# Patient Record
Sex: Female | Born: 1955 | Race: Black or African American | Hispanic: No | Marital: Married | Smoking: Never smoker
Health system: Southern US, Community
[De-identification: ages and names within clinical notes are randomized; demographics above are authoritative.]

## PROBLEM LIST (undated history)

## (undated) DIAGNOSIS — I10 Essential (primary) hypertension: Secondary | ICD-10-CM

## (undated) DIAGNOSIS — T8859XA Other complications of anesthesia, initial encounter: Secondary | ICD-10-CM

## (undated) DIAGNOSIS — E785 Hyperlipidemia, unspecified: Secondary | ICD-10-CM

## (undated) DIAGNOSIS — Z9889 Other specified postprocedural states: Secondary | ICD-10-CM

## (undated) DIAGNOSIS — E119 Type 2 diabetes mellitus without complications: Secondary | ICD-10-CM

## (undated) DIAGNOSIS — R112 Nausea with vomiting, unspecified: Secondary | ICD-10-CM

## (undated) DIAGNOSIS — T4145XA Adverse effect of unspecified anesthetic, initial encounter: Secondary | ICD-10-CM

## (undated) HISTORY — DX: Type 2 diabetes mellitus without complications: E11.9

## (undated) HISTORY — DX: Hyperlipidemia, unspecified: E78.5

## (undated) HISTORY — PX: COLONOSCOPY: SHX174

## (undated) HISTORY — DX: Essential (primary) hypertension: I10

## (undated) HISTORY — PX: ABDOMINAL HYSTERECTOMY: SHX81

## (undated) HISTORY — PX: HERNIA REPAIR: SHX51

---

## 2004-12-04 ENCOUNTER — Encounter: Admission: RE | Admit: 2004-12-04 | Discharge: 2004-12-04 | Payer: Self-pay | Admitting: "Endocrinology

## 2006-01-02 ENCOUNTER — Encounter: Admission: RE | Admit: 2006-01-02 | Discharge: 2006-01-02 | Payer: Self-pay | Admitting: "Endocrinology

## 2006-10-03 ENCOUNTER — Ambulatory Visit: Payer: Self-pay | Admitting: Internal Medicine

## 2006-10-23 ENCOUNTER — Ambulatory Visit: Payer: Self-pay | Admitting: Gastroenterology

## 2007-03-31 ENCOUNTER — Encounter: Admission: RE | Admit: 2007-03-31 | Discharge: 2007-03-31 | Payer: Self-pay | Admitting: Internal Medicine

## 2008-04-28 ENCOUNTER — Encounter: Admission: RE | Admit: 2008-04-28 | Discharge: 2008-04-28 | Payer: Self-pay | Admitting: Internal Medicine

## 2009-08-30 ENCOUNTER — Encounter: Admission: RE | Admit: 2009-08-30 | Discharge: 2009-08-30 | Payer: Self-pay | Admitting: Internal Medicine

## 2010-07-02 ENCOUNTER — Encounter: Payer: Self-pay | Admitting: Internal Medicine

## 2010-12-08 ENCOUNTER — Other Ambulatory Visit: Payer: Self-pay | Admitting: Internal Medicine

## 2010-12-08 DIAGNOSIS — Z1231 Encounter for screening mammogram for malignant neoplasm of breast: Secondary | ICD-10-CM

## 2010-12-22 ENCOUNTER — Ambulatory Visit
Admission: RE | Admit: 2010-12-22 | Discharge: 2010-12-22 | Disposition: A | Discharge status: Home / Self Care | Payer: BC Managed Care – PPO | Source: Ambulatory Visit | Attending: Internal Medicine | Admitting: Internal Medicine

## 2010-12-22 DIAGNOSIS — Z1231 Encounter for screening mammogram for malignant neoplasm of breast: Secondary | ICD-10-CM

## 2011-01-10 ENCOUNTER — Encounter (INDEPENDENT_AMBULATORY_CARE_PROVIDER_SITE_OTHER): Payer: BC Managed Care – PPO | Admitting: Ophthalmology

## 2011-06-13 ENCOUNTER — Encounter (INDEPENDENT_AMBULATORY_CARE_PROVIDER_SITE_OTHER): Payer: BC Managed Care – PPO | Admitting: Ophthalmology

## 2011-06-27 ENCOUNTER — Encounter (INDEPENDENT_AMBULATORY_CARE_PROVIDER_SITE_OTHER): Payer: BC Managed Care – PPO | Admitting: Ophthalmology

## 2011-06-27 DIAGNOSIS — E11319 Type 2 diabetes mellitus with unspecified diabetic retinopathy without macular edema: Secondary | ICD-10-CM

## 2011-06-27 DIAGNOSIS — H43819 Vitreous degeneration, unspecified eye: Secondary | ICD-10-CM

## 2011-06-27 DIAGNOSIS — H2510 Age-related nuclear cataract, unspecified eye: Secondary | ICD-10-CM

## 2011-06-27 DIAGNOSIS — E1139 Type 2 diabetes mellitus with other diabetic ophthalmic complication: Secondary | ICD-10-CM

## 2012-02-26 ENCOUNTER — Other Ambulatory Visit: Payer: Self-pay | Admitting: Internal Medicine

## 2012-02-26 DIAGNOSIS — Z1231 Encounter for screening mammogram for malignant neoplasm of breast: Secondary | ICD-10-CM

## 2012-03-17 ENCOUNTER — Ambulatory Visit
Admission: RE | Admit: 2012-03-17 | Discharge: 2012-03-17 | Disposition: A | Discharge status: Home / Self Care | Payer: BC Managed Care – PPO | Source: Ambulatory Visit | Attending: Internal Medicine | Admitting: Internal Medicine

## 2012-03-17 ENCOUNTER — Ambulatory Visit: Payer: BC Managed Care – PPO

## 2012-03-17 DIAGNOSIS — Z1231 Encounter for screening mammogram for malignant neoplasm of breast: Secondary | ICD-10-CM

## 2012-03-19 ENCOUNTER — Encounter (INDEPENDENT_AMBULATORY_CARE_PROVIDER_SITE_OTHER): Payer: BC Managed Care – PPO | Admitting: Ophthalmology

## 2012-03-19 DIAGNOSIS — E11319 Type 2 diabetes mellitus with unspecified diabetic retinopathy without macular edema: Secondary | ICD-10-CM

## 2012-03-19 DIAGNOSIS — H35039 Hypertensive retinopathy, unspecified eye: Secondary | ICD-10-CM

## 2012-03-19 DIAGNOSIS — E1139 Type 2 diabetes mellitus with other diabetic ophthalmic complication: Secondary | ICD-10-CM

## 2012-03-19 DIAGNOSIS — H43819 Vitreous degeneration, unspecified eye: Secondary | ICD-10-CM

## 2012-03-19 DIAGNOSIS — I10 Essential (primary) hypertension: Secondary | ICD-10-CM

## 2012-03-19 DIAGNOSIS — H2510 Age-related nuclear cataract, unspecified eye: Secondary | ICD-10-CM

## 2012-06-27 ENCOUNTER — Ambulatory Visit (INDEPENDENT_AMBULATORY_CARE_PROVIDER_SITE_OTHER): Payer: BC Managed Care – PPO | Admitting: Ophthalmology

## 2012-11-09 ENCOUNTER — Ambulatory Visit (INDEPENDENT_AMBULATORY_CARE_PROVIDER_SITE_OTHER): Payer: BC Managed Care – PPO | Admitting: Family Medicine

## 2012-11-09 VITALS — BP 113/71 | HR 97 | Temp 98.3°F | Resp 20 | Ht 63.0 in | Wt 217.0 lb

## 2012-11-09 DIAGNOSIS — R059 Cough, unspecified: Secondary | ICD-10-CM

## 2012-11-09 DIAGNOSIS — R05 Cough: Secondary | ICD-10-CM

## 2012-11-09 DIAGNOSIS — J029 Acute pharyngitis, unspecified: Secondary | ICD-10-CM

## 2012-11-09 DIAGNOSIS — E119 Type 2 diabetes mellitus without complications: Secondary | ICD-10-CM

## 2012-11-09 LAB — POCT CBC
Granulocyte percent: 73.4 %G (ref 37–80)
HCT, POC: 39.8 % (ref 37.7–47.9)
Lymph, poc: 1.5 (ref 0.6–3.4)
MCHC: 30.2 g/dL — AB (ref 31.8–35.4)
POC LYMPH PERCENT: 20.2 %L (ref 10–50)
Platelet Count, POC: 250 10*3/uL (ref 142–424)
RBC: 4 M/uL — AB (ref 4.04–5.48)
WBC: 7.6 10*3/uL (ref 4.6–10.2)

## 2012-11-09 LAB — GLUCOSE, POCT (MANUAL RESULT ENTRY): POC Glucose: 118 mg/dl — AB (ref 70–99)

## 2012-11-09 LAB — POCT RAPID STREP A (OFFICE): Rapid Strep A Screen: NEGATIVE

## 2012-11-09 MED ORDER — HYDROCOD POLST-CHLORPHEN POLST 10-8 MG/5ML PO LQCR: 5.0000 mL | Freq: Two times a day (BID) | ORAL | Status: DC | PRN

## 2012-11-09 MED ORDER — AMOXICILLIN-POT CLAVULANATE 875-125 MG PO TABS: 1.0000 | ORAL_TABLET | Freq: Two times a day (BID) | ORAL | Status: DC

## 2012-11-09 NOTE — Patient Instructions (Addendum)

## 2012-11-09 NOTE — Progress Notes (Signed)
[address]   [place], [place]  [address]   [phone]  Subjective:    Patient ID: Faith Perez, female    DOB: Jun 07, 1956, 57 y.o.   MRN: [id number]  HPI This 57 y.o. female presents for evaluation of cold symptoms. Onset five days ago.  +feverish.  +chills/sweats.  +HA yesterday.  No ear pain today but has been painful earlier in week.  +ST moderately severe; diffuse pain; pain with swallowing; no pain with talking.  Scant rhinorrhea; no nasal congestion; +coughing a lot; +scant sputum.  +SOB; +fatigued/malaise.  No wheezing.  No v/d.  Husband with similar symptoms.  No tobacco.  Not checking sugars at home.  PCP: Light.   Review of Systems  Constitutional: Positive for fever, chills, diaphoresis and fatigue.  HENT: Negative for ear pain, congestion, rhinorrhea, sneezing and postnasal drip.   Respiratory: Positive for cough and shortness of breath. Negative for wheezing and stridor.   Gastrointestinal: Negative for nausea, vomiting and diarrhea.  Skin: Negative for rash.  Neurological: Positive for headaches. Negative for dizziness and light-headedness.    Past Medical History  Diagnosis Date  . Hypertension   . Diabetes mellitus without complication   . Hyperlipidemia     Past Surgical History  Procedure Laterality Date  . Hernia repair    . Abdominal hysterectomy      Prior to Admission medications   Medication Sig Start Date End Date Taking? Authorizing Provider  aspirin 81 MG tablet Take 81 mg by mouth daily.   Yes Historical Provider, MD  ezetimibe (ZETIA) 10 MG tablet Take 10 mg by mouth daily.   Yes Historical Provider, MD  lisinopril-hydrochlorothiazide (PRINZIDE,ZESTORETIC) 20-25 MG per tablet Take 1 tablet by mouth daily.   Yes Historical Provider, MD  metFORMIN (GLUCOPHAGE) 500 MG tablet Take 500 mg by mouth once.   Yes Historical Provider, MD  pioglitazone (ACTOS) 45 MG tablet Take 45 mg by mouth daily.   Yes Historical Provider, MD    No Known  Allergies  History   Social History  . Marital Status: Married    Spouse Name: N/A    Number of Children: N/A  . Years of Education: N/A   Occupational History  . employed     works in school; Musician   Social History Main Topics  . Smoking status: Never Smoker   . Smokeless tobacco: Not on file  . Alcohol Use: Yes     Comment: social  . Drug Use: Not on file  . Sexually Active: Not on file   Other Topics Concern  . Not on file   Social History Narrative  . No narrative on file    Family History  Problem Relation Age of Onset  . Heart failure Mother   . Hypertension Mother   . Diabetes Sister   . Diabetes Sister        Objective:   Physical Exam  Nursing note and vitals reviewed. Constitutional: She is oriented to person, place, and time. She appears well-developed and well-nourished. No distress.  HENT:  Head: Normocephalic and atraumatic.  Right Ear: External ear normal.  Left Ear: External ear normal.  Nose: Nose normal.  Mouth/Throat: Oropharynx is clear and moist. No oropharyngeal exudate.  Eyes: Conjunctivae are normal. Pupils are equal, round, and reactive to light.  Neck: Normal range of motion. Neck supple.  Cardiovascular: Normal rate, regular rhythm and normal heart sounds.   No murmur heard. Pulmonary/Chest: Effort normal and breath sounds normal. She  has no wheezes. She has no rales.  Lymphadenopathy:    She has no cervical adenopathy.  Neurological: She is alert and oriented to person, place, and time.  Skin: She is not diaphoretic.  Psychiatric: She has a normal mood and affect. Her behavior is normal.    Results for orders placed in visit on 11/09/12  GLUCOSE, POCT (MANUAL RESULT ENTRY)      Result Value Range   POC Glucose 118 (*) 70 - 99 mg/dl  POCT CBC      Result Value Range   WBC 7.6  4.6 - 10.2 K/uL   Lymph, poc 1.5  0.6 - 3.4   POC LYMPH PERCENT 20.2  10 - 50 %L   MID (cbc) 0.5  0 - 0.9   POC MID % 6.4  0 - 12 %M   POC  Granulocyte 5.6  2 - 6.9   Granulocyte percent 73.4  37 - 80 %G   RBC 4.00 (*) 4.04 - 5.48 M/uL   Hemoglobin 12.0 (*) 12.2 - 16.2 g/dL   HCT, POC 16.1  09.6 - 47.9 %   MCV 99.4 (*) 80 - 97 fL   MCH, POC 30.0  27 - 31.2 pg   MCHC 30.2 (*) 31.8 - 35.4 g/dL   RDW, POC 04.5     Platelet Count, POC 250  142 - 424 K/uL   MPV 9.0  0 - 99.8 fL  POCT RAPID STREP A (OFFICE)      Result Value Range   Rapid Strep A Screen Negative  Negative       Assessment & Plan:  Sore throat - Plan: POCT CBC, POCT rapid strep A, Culture, Group A Strep  Diabetes - Plan: POCT glucose (manual entry)  Cough - Plan: POCT CBC  1. URI:  New.  Rx for Augmentin, Tussionex due to clinical worsening over past several days .  Supportive care with rest, fluids, Ibuprofen or Tylenol. 2.  DMII:  Stable; monitor sugars closely with acute illness.    Meds ordered this encounter  Medications  . ezetimibe (ZETIA) 10 MG tablet    Sig: Take 10 mg by mouth daily.  . metFORMIN (GLUCOPHAGE) 500 MG tablet    Sig: Take 500 mg by mouth once.  Marland Kitchen aspirin 81 MG tablet    Sig: Take 81 mg by mouth daily.  Marland Kitchen lisinopril-hydrochlorothiazide (PRINZIDE,ZESTORETIC) 20-25 MG per tablet    Sig: Take 1 tablet by mouth daily.  . pioglitazone (ACTOS) 45 MG tablet    Sig: Take 45 mg by mouth daily.  . chlorpheniramine-HYDROcodone (TUSSIONEX PENNKINETIC ER) 10-8 MG/5ML LQCR    Sig: Take 5 mLs by mouth every 12 (twelve) hours as needed.    Dispense:  240 mL    Refill:  0  . amoxicillin-clavulanate (AUGMENTIN) 875-125 MG per tablet    Sig: Take 1 tablet by mouth 2 (two) times daily.    Dispense:  20 tablet    Refill:  0

## 2012-11-11 LAB — CULTURE, GROUP A STREP: Organism ID, Bacteria: NORMAL

## 2013-03-17 ENCOUNTER — Other Ambulatory Visit: Payer: Self-pay

## 2013-03-17 DIAGNOSIS — Z1231 Encounter for screening mammogram for malignant neoplasm of breast: Secondary | ICD-10-CM

## 2013-03-30 ENCOUNTER — Ambulatory Visit: Payer: BC Managed Care – PPO

## 2013-04-16 ENCOUNTER — Ambulatory Visit
Admission: RE | Admit: 2013-04-16 | Discharge: 2013-04-16 | Disposition: A | Discharge status: Home / Self Care | Payer: BC Managed Care – PPO | Source: Ambulatory Visit

## 2013-04-16 DIAGNOSIS — Z1231 Encounter for screening mammogram for malignant neoplasm of breast: Secondary | ICD-10-CM

## 2014-03-12 ENCOUNTER — Other Ambulatory Visit: Payer: Self-pay

## 2014-03-12 DIAGNOSIS — Z1239 Encounter for other screening for malignant neoplasm of breast: Secondary | ICD-10-CM

## 2014-04-21 ENCOUNTER — Ambulatory Visit
Admission: RE | Admit: 2014-04-21 | Discharge: 2014-04-21 | Disposition: A | Discharge status: Home / Self Care | Payer: BC Managed Care – PPO | Source: Ambulatory Visit

## 2014-04-21 ENCOUNTER — Encounter (INDEPENDENT_AMBULATORY_CARE_PROVIDER_SITE_OTHER): Payer: BC Managed Care – PPO | Admitting: Ophthalmology

## 2014-04-21 DIAGNOSIS — Z1239 Encounter for other screening for malignant neoplasm of breast: Secondary | ICD-10-CM

## 2014-06-23 ENCOUNTER — Encounter (INDEPENDENT_AMBULATORY_CARE_PROVIDER_SITE_OTHER): Payer: BC Managed Care – PPO | Admitting: Ophthalmology

## 2014-06-23 DIAGNOSIS — E11311 Type 2 diabetes mellitus with unspecified diabetic retinopathy with macular edema: Secondary | ICD-10-CM

## 2014-06-23 DIAGNOSIS — E11329 Type 2 diabetes mellitus with mild nonproliferative diabetic retinopathy without macular edema: Secondary | ICD-10-CM

## 2014-06-23 DIAGNOSIS — H35033 Hypertensive retinopathy, bilateral: Secondary | ICD-10-CM

## 2014-06-23 DIAGNOSIS — I10 Essential (primary) hypertension: Secondary | ICD-10-CM

## 2014-06-23 DIAGNOSIS — H43813 Vitreous degeneration, bilateral: Secondary | ICD-10-CM

## 2014-06-23 DIAGNOSIS — E11331 Type 2 diabetes mellitus with moderate nonproliferative diabetic retinopathy with macular edema: Secondary | ICD-10-CM

## 2014-12-11 ENCOUNTER — Ambulatory Visit (INDEPENDENT_AMBULATORY_CARE_PROVIDER_SITE_OTHER): Payer: BC Managed Care – PPO | Admitting: Emergency Medicine

## 2014-12-11 ENCOUNTER — Ambulatory Visit (INDEPENDENT_AMBULATORY_CARE_PROVIDER_SITE_OTHER): Payer: BC Managed Care – PPO

## 2014-12-11 VITALS — BP 142/72 | HR 73 | Temp 98.0°F | Resp 17 | Ht 63.0 in | Wt 234.2 lb

## 2014-12-11 DIAGNOSIS — R05 Cough: Secondary | ICD-10-CM

## 2014-12-11 DIAGNOSIS — I359 Nonrheumatic aortic valve disorder, unspecified: Secondary | ICD-10-CM

## 2014-12-11 DIAGNOSIS — E119 Type 2 diabetes mellitus without complications: Secondary | ICD-10-CM | POA: Diagnosis not present

## 2014-12-11 DIAGNOSIS — R059 Cough, unspecified: Secondary | ICD-10-CM

## 2014-12-11 LAB — POCT CBC
Granulocyte percent: 64.1 %G (ref 37–80)
HEMATOCRIT: 39 % (ref 37.7–47.9)
HEMOGLOBIN: 12.6 g/dL (ref 12.2–16.2)
Lymph, poc: 1.8 (ref 0.6–3.4)
MCH: 29.6 pg (ref 27–31.2)
MCHC: 32.2 g/dL (ref 31.8–35.4)
MCV: 91.9 fL (ref 80–97)
MID (CBC): 0.4 (ref 0–0.9)
MPV: 7.4 fL (ref 0–99.8)
POC GRANULOCYTE: 3.9 (ref 2–6.9)
POC LYMPH %: 28.9 % (ref 10–50)
POC MID %: 7 % (ref 0–12)
Platelet Count, POC: 271 10*3/uL (ref 142–424)
RBC: 4.24 M/uL (ref 4.04–5.48)
RDW, POC: 15.5 %
WBC: 6.1 10*3/uL (ref 4.6–10.2)

## 2014-12-11 LAB — GLUCOSE, POCT (MANUAL RESULT ENTRY): POC GLUCOSE: 103 mg/dL — AB (ref 70–99)

## 2014-12-11 MED ORDER — CEFDINIR 300 MG PO CAPS: 600.0000 mg | ORAL_CAPSULE | Freq: Every day | ORAL | Status: DC

## 2014-12-11 MED ORDER — HYDROCOD POLST-CPM POLST ER 10-8 MG/5ML PO SUER: ORAL | Status: DC

## 2014-12-11 MED ORDER — ALBUTEROL SULFATE (2.5 MG/3ML) 0.083% IN NEBU
2.5000 mg | INHALATION_SOLUTION | Freq: Once | RESPIRATORY_TRACT | Status: AC
  Administered 2014-12-11: 2.5 mg via RESPIRATORY_TRACT

## 2014-12-11 NOTE — Progress Notes (Addendum)
Subjective:  This chart was scribed for Nena Jordan, MD by [facility], medical scribe at [facility].The patient was seen in exam room 12 and the patient's care was started at 9:58 AM.   Patient ID: Faith Perez, female    DOB: December 09, 1955, 59 y.o.   MRN: [id number] Chief Complaint  Patient presents with   Bronchitis    dia. 12/07/2014 with primary, not getting better    Nasal Congestion    also alot in chest    HPI HPI Comments: Faith Perez is a 59 y.o. female who presents to [facility] complaining of a persistent bronchitis. She was by a physician previously who prescribed her Z-pak and tessalon perles. No x-ray was done. She says there is still rattling in her chest and wheezing. Pt is still producing a phlegm with her cough. The phlegm was yellow but now clear. No history of asthma, and never used an inhaler. She is not a smoker. No fever. 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 History of diabetes and no symptomatic high or lows. She does have a history of hypertension. She takes lisinopril and never had a cough as a side effect.  Review of Systems  Constitutional: Negative for fatigue.  Respiratory: Positive for cough and wheezing.       Objective:  BP 142/72 mmHg   Pulse 73   Temp(Src) 98 F (36.7 C) (Oral)   Resp 17   Ht 5\' 3"  (1.6 m)   Wt 234 lb 3.2 oz (106.232 kg)   BMI 41.50 kg/m2   SpO2 97% Physical Exam  Vitals reviewed. CONSTITUTIONAL: Well developed/well nourished HEAD: Normocephalic/atraumatic EYES: EOMI/PERRL ENMT: Mucous membranes moist NECK: supple no meningeal signs SPINE/BACK:entire spine nontender CV: S1/S2 noted, no murmurs/rubs/gallops noted LUNGS: Lungs are clear to auscultation bilaterally, no apparent distress.  ABDOMEN: soft, nontender, no rebound or guarding, bowel sounds noted throughout abdomen GU:no cva tenderness NEURO: Pt is awake/alert/appropriate, moves all extremitiesx4.  No facial droop.     EXTREMITIES: pulses normal/equal, full ROM SKIN: warm, color normal PSYCH: no abnormalities of mood noted, alert and oriented to situation [facility] reading (PRIMARY) by  Dr. Everlene Farrier lung fields are clear. There is calcification of the aortic valvular area Results for orders placed or performed in visit on 12/11/14  POCT CBC  Result Value Ref Range   WBC 6.1 4.6 - 10.2 K/uL   Lymph, poc 1.8 0.6 - 3.4   POC LYMPH PERCENT 28.9 10 - 50 %L   MID (cbc) 0.4 0 - 0.9   POC MID % 7.0 0 - 12 %M   POC Granulocyte 3.9 2 - 6.9   Granulocyte percent 64.1 37 - 80 %G   RBC 4.24 4.04 - 5.48 M/uL   Hemoglobin 12.6 12.2 - 16.2 g/dL   HCT, POC 39.0 37.7 - 47.9 %   MCV 91.9 80 - 97 fL   MCH, POC 29.6 27 - 31.2 pg   MCHC 32.2 31.8 - 35.4 g/dL   RDW, POC 15.5 %   Platelet Count, POC 271 142 - 424 K/uL   MPV 7.4 0 - 99.8 fL  POCT glucose (manual entry)  Result Value Ref Range   POC Glucose 103 (A) 70 - 99 mg/dl      Assessment & Plan:  This really sounds like an upper airway cough. Will treat with Omnicef and Tussionex for cough. She denies any previous history with her ACE inhibitor. She  has been on it for a number of years and does not feel like this is triggering her cough.I personally performed the services described in this documentation, which was scribed in my presence. The recorded information has been reviewed and is accurate.  Nena Jordan, MD

## 2015-02-08 ENCOUNTER — Emergency Department (INDEPENDENT_AMBULATORY_CARE_PROVIDER_SITE_OTHER)
Admission: EM | Admit: 2015-02-08 | Discharge: 2015-02-08 | Disposition: A | Discharge status: Home / Self Care | Payer: Worker's Compensation | Source: Home / Self Care | Attending: Family Medicine | Admitting: Family Medicine

## 2015-02-08 ENCOUNTER — Encounter (HOSPITAL_COMMUNITY): Payer: Self-pay | Admitting: Emergency Medicine

## 2015-02-08 ENCOUNTER — Emergency Department (INDEPENDENT_AMBULATORY_CARE_PROVIDER_SITE_OTHER): Payer: Worker's Compensation

## 2015-02-08 DIAGNOSIS — Y990 Civilian activity done for income or pay: Secondary | ICD-10-CM

## 2015-02-08 DIAGNOSIS — M25562 Pain in left knee: Secondary | ICD-10-CM | POA: Diagnosis not present

## 2015-02-08 DIAGNOSIS — M25512 Pain in left shoulder: Secondary | ICD-10-CM

## 2015-02-08 DIAGNOSIS — W19XXXA Unspecified fall, initial encounter: Secondary | ICD-10-CM | POA: Diagnosis not present

## 2015-02-08 NOTE — ED Provider Notes (Signed)
Faith Perez is a 59 y.o. female who presents [facility] today for fall at work. Patient tripped and fell today at work. She tripped over a telephone cord. She landed on her hands and knees. She notes left anterior knee pain. The pain is mild and worsening throughout the day. Pain is worse with ambulation. No radiating pain weakness or numbness. Additionally she notes left shoulder pain. This pain is moderate and worse with overhead motion reaching back. This pain is also worsening throughout the day. She denies any wrist pain currently. She notes some mild low back pain as well. She has not tried any medications yet. No fevers chills nausea vomiting or diarrhea.   Past Medical History  Diagnosis Date  . Hypertension   . Diabetes mellitus without complication   . Hyperlipidemia    Past Surgical History  Procedure Laterality Date  . Hernia repair    . Abdominal hysterectomy     Social History  Substance Use Topics  . Smoking status: Never Smoker   . Smokeless tobacco: Not on file  . Alcohol Use: Yes     Comment: social   ROS as above Medications: No current facility-administered medications for this encounter.   Current Outpatient Prescriptions  Medication Sig Dispense Refill  . aspirin 81 MG tablet Take 81 mg by mouth daily.    Marland Kitchen lisinopril-hydrochlorothiazide (PRINZIDE,ZESTORETIC) 20-25 MG per tablet Take 1 tablet by mouth daily.    . metFORMIN (GLUCOPHAGE) 500 MG tablet Take 500 mg by mouth once.    . pioglitazone (ACTOS) 45 MG tablet Take 45 mg by mouth daily.    Marland Kitchen azithromycin (ZITHROMAX) 250 MG tablet Take by mouth daily.    . benzonatate (TESSALON) 200 MG capsule Take 200 mg by mouth 3 (three) times daily as needed for cough.    . cefdinir (OMNICEF) 300 MG capsule Take 2 capsules (600 mg total) by mouth daily. 20 capsule 0  . chlorpheniramine-HYDROcodone (TUSSIONEX PENNKINETIC ER) 10-8 MG/5ML LQCR Take 5 mLs by mouth every 12 (twelve) hours as needed. (Patient not taking:  Reported on 12/11/2014) 240 mL 0  . chlorpheniramine-HYDROcodone (TUSSIONEX PENNKINETIC ER) 10-8 MG/5ML SUER Take 1/2-1 teaspoon every 12 hours 90 mL 0  . ezetimibe (ZETIA) 10 MG tablet Take 10 mg by mouth daily.     No Known Allergies   Exam:  BP 158/78 mmHg  Pulse 55  Temp(Src) 97.9 F (36.6 C) (Oral)  Resp 16  SpO2 99% Gen: Well NAD HEENT: EOMI,  MMM Lungs: Normal work of breathing. CTABL Heart: RRR no MRG Abd: NABS, Soft. Nondistended, Nontender Exts: Brisk capillary refill, warm and well perfused.  Left knee normal-appearing. Tender palpation anterior knee. Normal motion. Stable ligamentous exam. Left shoulder normal-appearing nontender normal motion and wrist nontender bilaterally. Back nontender. Normal gait.  No results found for this or any previous visit (from the past 24 hour(s)). Dg Knee Complete 4 Views Left  02/08/2015   CLINICAL DATA:  Status post fall today with pain in the left knee.  EXAM: LEFT KNEE - COMPLETE 4+ VIEW  COMPARISON:  None.  FINDINGS: There is no evidence of fracture, dislocation, or joint effusion. There is no evidence of arthropathy or other focal bone abnormality. Soft tissues are unremarkable.  IMPRESSION: Negative.   Electronically Signed   By: Abelardo Diesel M.D.   On: 02/08/2015 19:32    Assessment and Plan: 59 y.o. female with fall and left knee and left shoulder pain. No evidence of fracture. Likely contusion.  Possible mild rotator cuff injury. Plan for watchful waiting and refer to occupational health. Pain management with over-the-counter medications.  Discussed warning signs or symptoms. Please see discharge instructions. Patient expresses understanding.      Gregor Hams, MD 02/08/15 [phone]

## 2015-02-08 NOTE — Discharge Instructions (Signed)
Thank you for coming in today. Take ibuprofen or Tylenol for up to 2 Aleve twice daily for pain Follow-up with occupational health  Contusion A contusion is the result of an injury to the skin and underlying tissues and is usually caused by direct trauma. The injury results in the appearance of a bruise on the skin overlying the injured tissues. Contusions cause rupture and bleeding of the small capillaries and blood vessels and affect function, because the bleeding infiltrates muscles, tendons, nerves, or other soft tissues.  SYMPTOMS   Swelling and often a hard lump in the injured area, either superficial or deep.  Pain and tenderness over the area of the contusion.  Feeling of firmness when pressure is exerted over the contusion.  Discoloration under the skin, beginning with redness and progressing to the characteristic "black and blue" bruise. CAUSES  A contusion is typically the result of direct trauma. This is often by a blunt object.  RISK INCREASES WITH:  Sports that have a high likelihood of trauma (football, boxing, ice hockey, soccer, field hockey, martial arts, basketball, and baseball).  Sports that make falling from a height likely (high-jumping, pole-vaulting, skating, or gymnastics).  Any bleeding disorder (hemophilia) or taking medications that affect clotting (aspirin, nonsteroidal anti-inflammatory medications, or warfarin [Coumadin]).  Inadequate protection of exposed areas during contact sports. PREVENTION  Maintain physical fitness:  Joint and muscle flexibility.  Strength and endurance.  Coordination.  Wear proper protective equipment. Make sure it fits correctly. PROGNOSIS  Contusions typically heal without any complications. Healing time varies with the severity of injury and intake of medications that affect clotting. Contusions usually heal in 1 to 4 weeks. RELATED COMPLICATIONS   Damage to nearby nerves or blood vessels, causing numbness, coldness,  or paleness.  Compartment syndrome.  Bleeding into the soft tissues that leads to disability.  Infiltrative-type bleeding, leading to the calcification and impaired function of the injured muscle (rare).  Prolonged healing time if usual activities are resumed too soon.  Infection if the skin over the injury site is broken.  Fracture of the bone underlying the contusion.  Stiffness in the joint where the injured muscle crosses. TREATMENT  Treatment initially consists of resting the injured area as well as medication and ice to reduce inflammation. The use of a compression bandage may also be helpful in minimizing inflammation. As pain diminishes and movement is tolerated, the joint where the affected muscle crosses should be moved to prevent stiffness and the shortening (contracture) of the joint. Movement of the joint should begin as soon as possible. It is also important to work on maintaining strength within the affected muscles. Occasionally, extra padding over the area of contusion may be recommended before returning to sports, particularly if re-injury is likely.  MEDICATION   If pain relief is necessary these medications are often recommended:  Nonsteroidal anti-inflammatory medications, such as aspirin and ibuprofen.  Other minor pain relievers, such as acetaminophen, are often recommended.  Prescription pain relievers may be given by your caregiver. Use only as directed and only as much as you need. HEAT AND COLD  Cold treatment (icing) relieves pain and reduces inflammation. Cold treatment should be applied for 10 to 15 minutes every 2 to 3 hours for inflammation and pain and immediately after any activity that aggravates your symptoms. Use ice packs or an ice massage. (To do an ice massage fill a large styrofoam cup with water and freeze. Tear a small amount of foam from the top so ice protrudes.  Massage ice firmly over the injured area in a circle about the size of a  softball.)  Heat treatment may be used prior to performing the stretching and strengthening activities prescribed by your caregiver, physical therapist, or athletic trainer. Use a heat pack or a warm soak. SEEK MEDICAL CARE IF:   Symptoms get worse or do not improve despite treatment in a few days.  You have difficulty moving a joint.  Any extremity becomes extremely painful, numb, pale, or cool (This is an emergency!).  Medication produces any side effects (bleeding, upset stomach, or allergic reaction).  Signs of infection (drainage from skin, headache, muscle aches, dizziness, fever, or general ill feeling) occur if skin was broken. Document Released: 05/28/2005 Document Revised: 08/20/2011 Document Reviewed: 09/09/2008 [facility] Patient Information 2015 [place], [place]. This information is not intended to replace advice given to you by your health care provider. Make sure you discuss any questions you have with your health care provider.

## 2015-02-08 NOTE — ED Notes (Signed)
Pt reports she fell around 1230 today at work. Reports she was sitting down and got up, when she tripped over phone cords [address] forward onto concrete flooring C/o left knee pain, back pain, and left arm pain Alert... Steady gait... No acute distress.

## 2015-03-18 ENCOUNTER — Other Ambulatory Visit: Payer: Self-pay

## 2015-03-18 DIAGNOSIS — Z1231 Encounter for screening mammogram for malignant neoplasm of breast: Secondary | ICD-10-CM

## 2015-04-25 ENCOUNTER — Ambulatory Visit
Admission: RE | Admit: 2015-04-25 | Discharge: 2015-04-25 | Disposition: A | Discharge status: Home / Self Care | Payer: BC Managed Care – PPO | Source: Ambulatory Visit

## 2015-04-25 DIAGNOSIS — Z1231 Encounter for screening mammogram for malignant neoplasm of breast: Secondary | ICD-10-CM

## 2015-06-29 ENCOUNTER — Ambulatory Visit (INDEPENDENT_AMBULATORY_CARE_PROVIDER_SITE_OTHER): Payer: BC Managed Care – PPO | Admitting: Ophthalmology

## 2016-04-23 ENCOUNTER — Other Ambulatory Visit: Payer: Self-pay | Admitting: Internal Medicine

## 2016-04-23 DIAGNOSIS — Z1231 Encounter for screening mammogram for malignant neoplasm of breast: Secondary | ICD-10-CM

## 2016-05-08 ENCOUNTER — Ambulatory Visit: Payer: Self-pay

## 2016-05-10 ENCOUNTER — Ambulatory Visit
Admission: RE | Admit: 2016-05-10 | Discharge: 2016-05-10 | Disposition: A | Discharge status: Home / Self Care | Payer: BC Managed Care – PPO | Source: Ambulatory Visit | Attending: Internal Medicine | Admitting: Internal Medicine

## 2016-05-10 DIAGNOSIS — Z1231 Encounter for screening mammogram for malignant neoplasm of breast: Secondary | ICD-10-CM

## 2016-05-14 ENCOUNTER — Ambulatory Visit (INDEPENDENT_AMBULATORY_CARE_PROVIDER_SITE_OTHER): Payer: BC Managed Care – PPO | Admitting: Ophthalmology

## 2016-05-14 DIAGNOSIS — E113293 Type 2 diabetes mellitus with mild nonproliferative diabetic retinopathy without macular edema, bilateral: Secondary | ICD-10-CM

## 2016-05-14 DIAGNOSIS — H43813 Vitreous degeneration, bilateral: Secondary | ICD-10-CM | POA: Diagnosis not present

## 2016-05-14 DIAGNOSIS — H35033 Hypertensive retinopathy, bilateral: Secondary | ICD-10-CM

## 2016-05-14 DIAGNOSIS — E11319 Type 2 diabetes mellitus with unspecified diabetic retinopathy without macular edema: Secondary | ICD-10-CM | POA: Diagnosis not present

## 2016-05-14 DIAGNOSIS — I10 Essential (primary) hypertension: Secondary | ICD-10-CM

## 2016-09-01 IMAGING — CR DG CHEST 2V
3 series · 3 of 3 positions shown · non-contrast
Comparison: None.

CLINICAL DATA: Cough, congestion

EXAM:
CHEST  2 VIEW

[PA (1 of 2)]
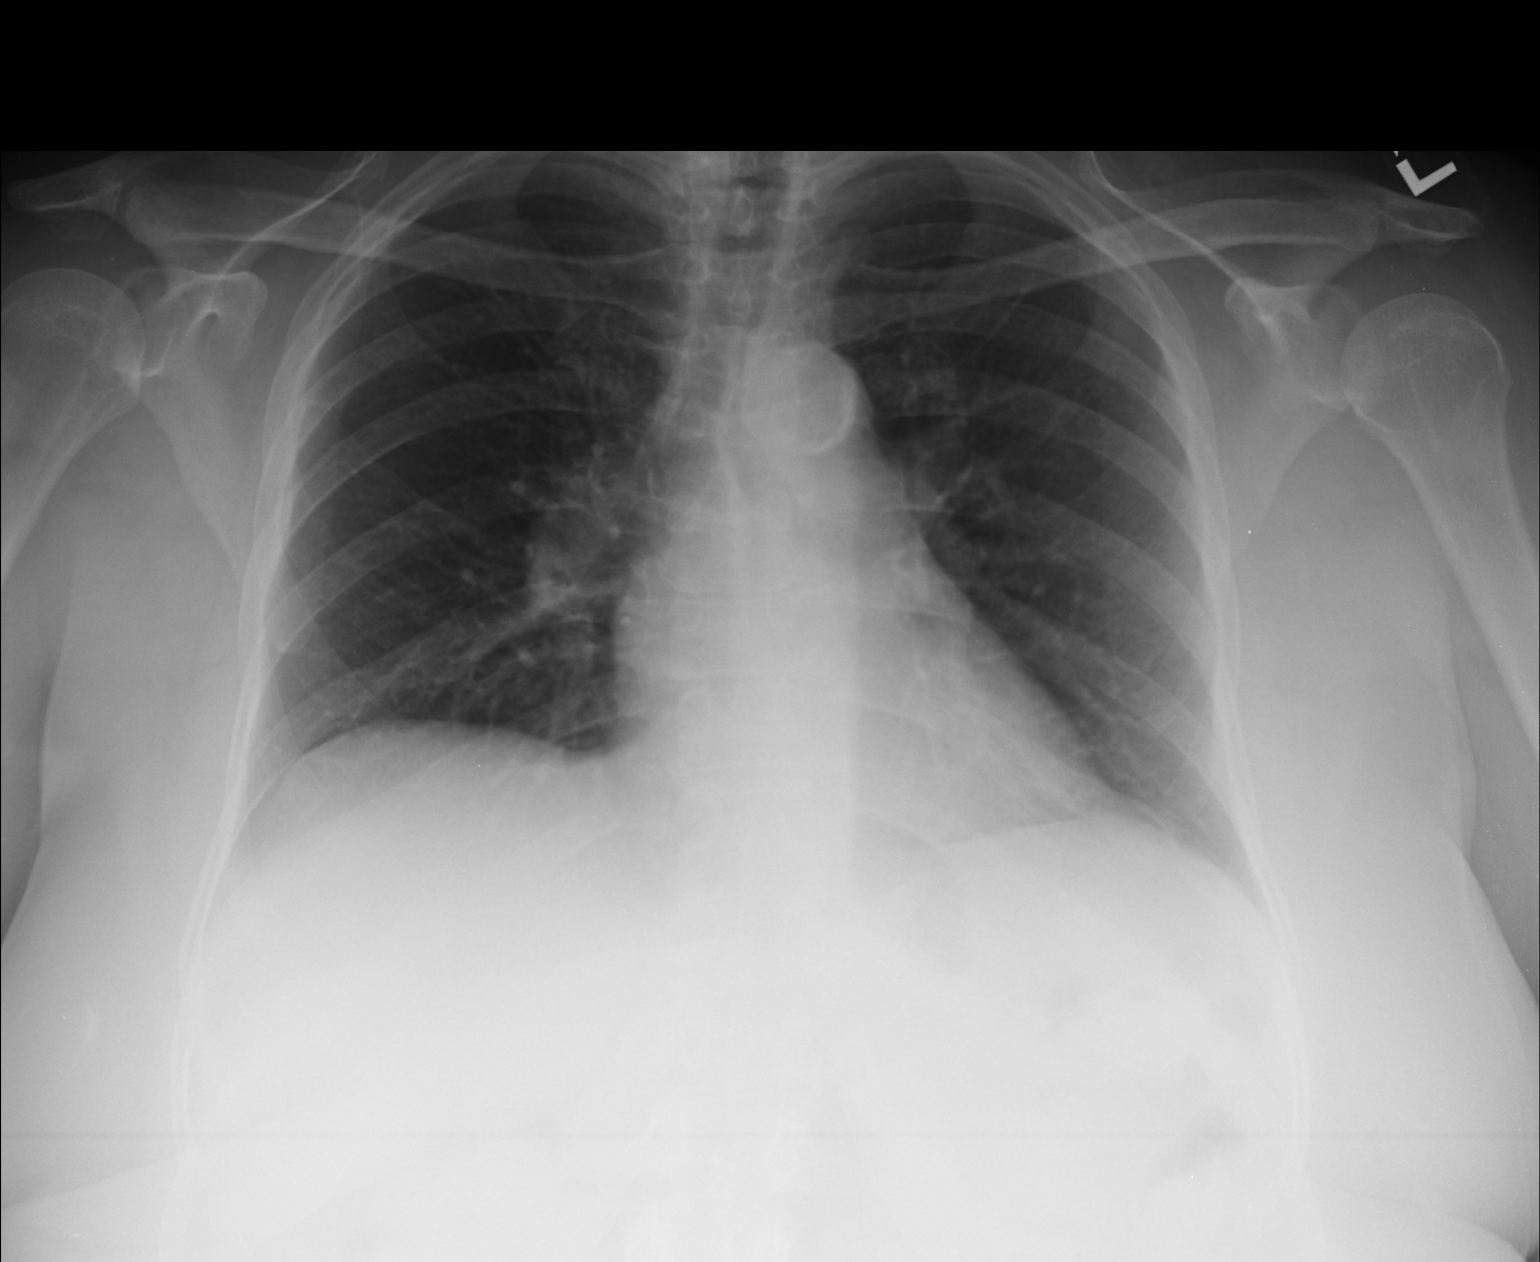

[lateral]
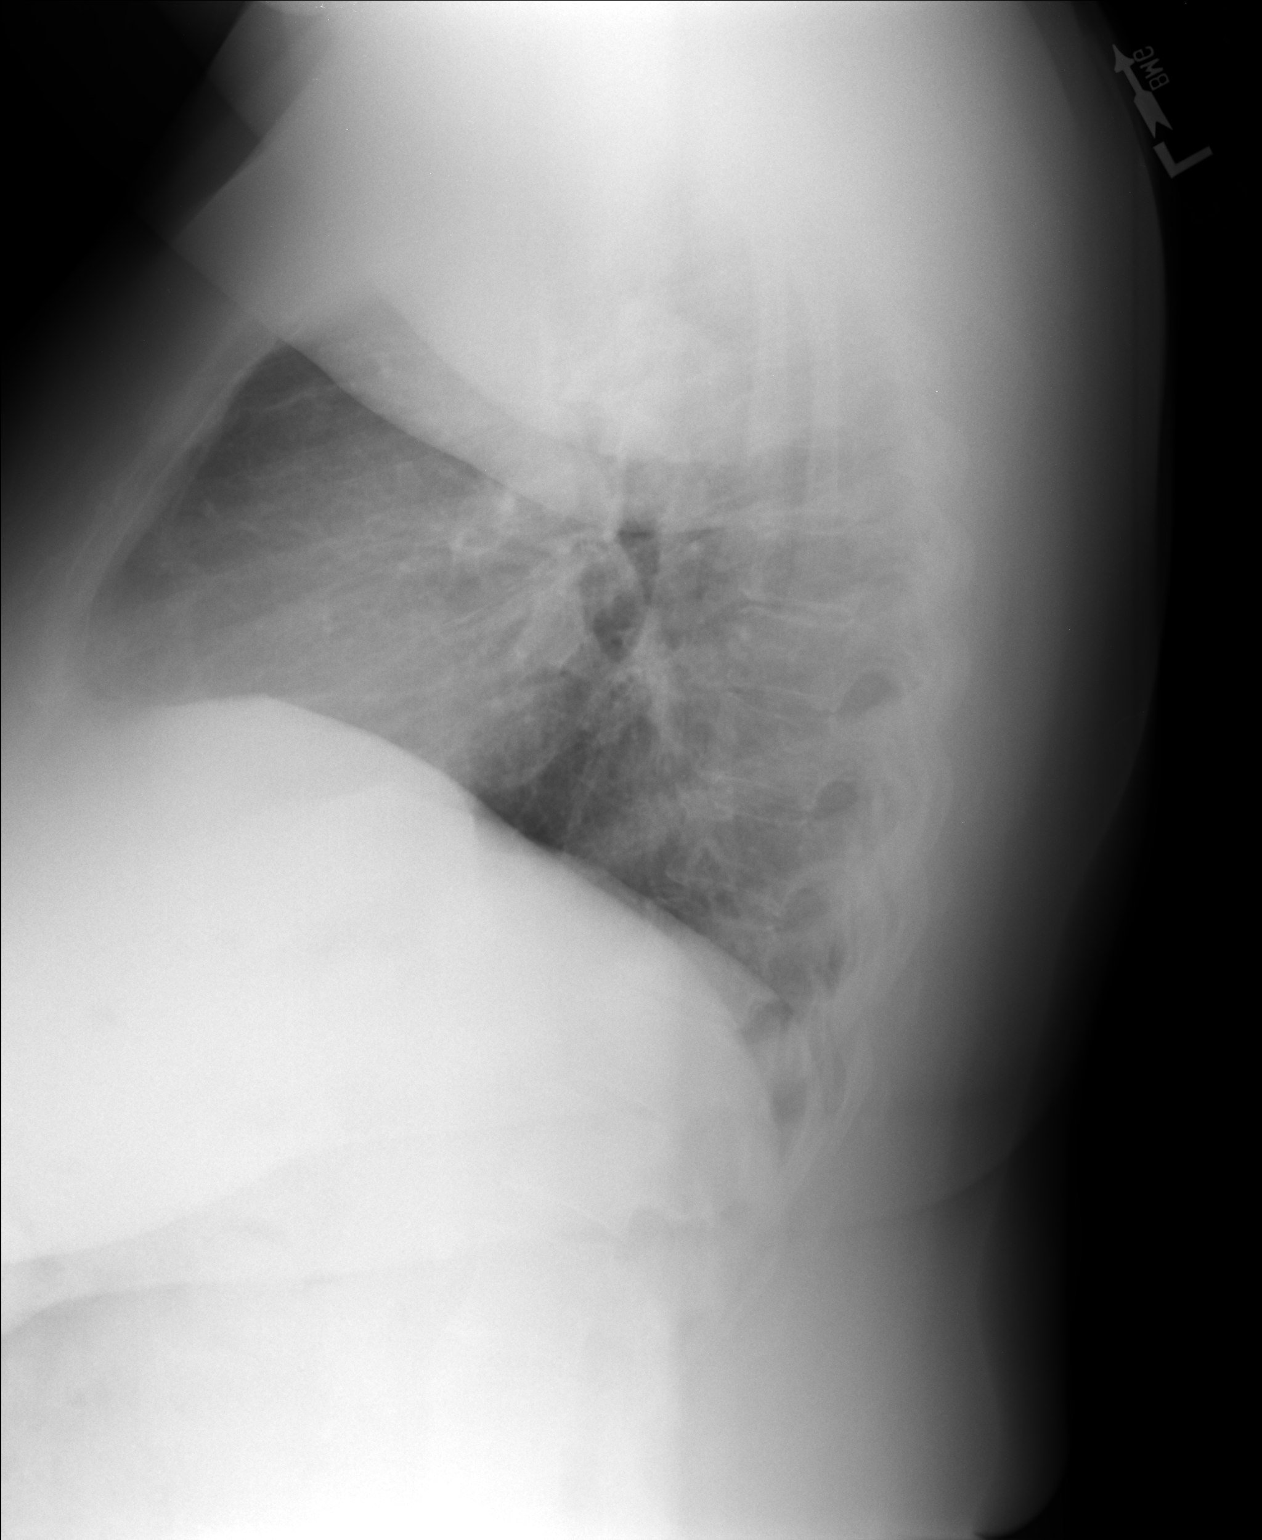

[PA (2 of 2)]
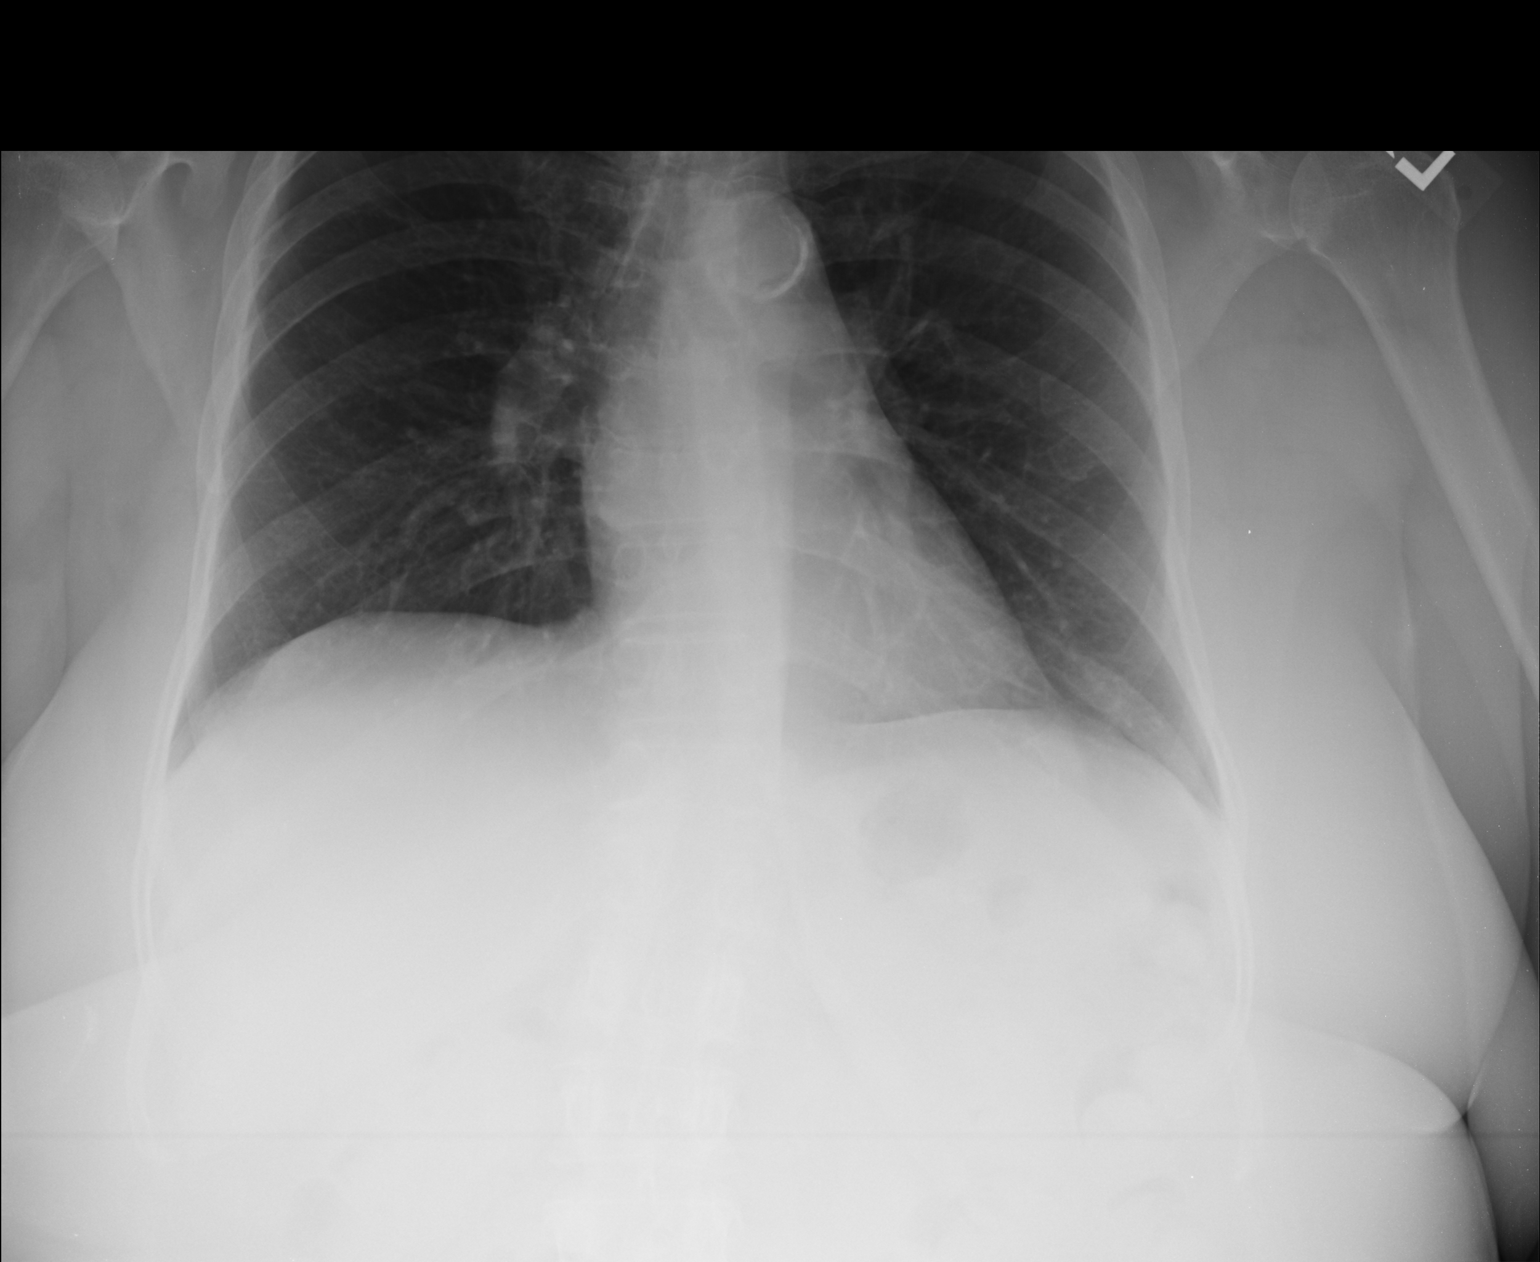

[3 of 3 positions shown; findings below may reference images not displayed]

FINDINGS: Heart is normal size. Calcifications in the aortic arch. Lungs are
clear. No effusions. No acute bony abnormality.
IMPRESSION: No active disease.

Aortic atherosclerosis.

## 2016-09-06 ENCOUNTER — Encounter: Payer: Self-pay | Admitting: Gastroenterology

## 2017-04-26 ENCOUNTER — Other Ambulatory Visit: Payer: Self-pay | Admitting: Internal Medicine

## 2017-04-26 DIAGNOSIS — Z1231 Encounter for screening mammogram for malignant neoplasm of breast: Secondary | ICD-10-CM

## 2017-05-14 ENCOUNTER — Ambulatory Visit (INDEPENDENT_AMBULATORY_CARE_PROVIDER_SITE_OTHER): Payer: BC Managed Care – PPO | Admitting: Ophthalmology

## 2017-05-14 DIAGNOSIS — E11319 Type 2 diabetes mellitus with unspecified diabetic retinopathy without macular edema: Secondary | ICD-10-CM | POA: Diagnosis not present

## 2017-05-14 DIAGNOSIS — H43813 Vitreous degeneration, bilateral: Secondary | ICD-10-CM

## 2017-05-14 DIAGNOSIS — I10 Essential (primary) hypertension: Secondary | ICD-10-CM | POA: Diagnosis not present

## 2017-05-14 DIAGNOSIS — E113293 Type 2 diabetes mellitus with mild nonproliferative diabetic retinopathy without macular edema, bilateral: Secondary | ICD-10-CM

## 2017-05-14 DIAGNOSIS — D3131 Benign neoplasm of right choroid: Secondary | ICD-10-CM | POA: Diagnosis not present

## 2017-05-14 DIAGNOSIS — H2513 Age-related nuclear cataract, bilateral: Secondary | ICD-10-CM

## 2017-05-14 DIAGNOSIS — H35033 Hypertensive retinopathy, bilateral: Secondary | ICD-10-CM

## 2017-05-27 ENCOUNTER — Ambulatory Visit
Admission: RE | Admit: 2017-05-27 | Discharge: 2017-05-27 | Disposition: A | Discharge status: Home / Self Care | Payer: BC Managed Care – PPO | Source: Ambulatory Visit | Attending: Internal Medicine | Admitting: Internal Medicine

## 2017-05-27 DIAGNOSIS — Z1231 Encounter for screening mammogram for malignant neoplasm of breast: Secondary | ICD-10-CM

## 2018-03-11 NOTE — Pre-Procedure Instructions (Signed)
Faith Perez  03/11/2018      [facility] [place] [address] [place] ([place]) [place] [address] Phone: [phone] Fax: [phone]  [place] [facility] [place], [place] - [address] [place] [address] Phone: [phone] Fax: [phone]    Your procedure is scheduled on Wednesday October 9th.  Report to [facility] Admitting at [place].M.  Call this number if you have problems the morning of surgery:  [phone]   Remember:  Do not eat or drink after midnight.      Take these medicines the morning of surgery with A SIP OF WATER  Zetia    7 days prior to surgery STOP taking any Aspirin(unless otherwise instructed by your surgeon), Aleve, Naproxen, Ibuprofen, Motrin, Advil, Goody's, BC's, all herbal medications, fish oil, and all vitamins    WHAT DO I DO ABOUT MY DIABETES MEDICATION?   Marland Kitchen Do not take oral diabetes medicines (pills) the morning of surgery : Metformin and Actos   How to Manage Your Diabetes Before and After Surgery  Why is it important to control my blood sugar before and after surgery? . Improving blood sugar levels before and after surgery helps healing and can limit problems. . A way of improving blood sugar control is eating a healthy diet by: o  Eating less sugar and carbohydrates o  Increasing activity/exercise o  Talking with your doctor about reaching your blood sugar goals . High blood sugars (greater than 180 mg/dL) can raise your risk of infections and slow your recovery, so you will need to focus on controlling your diabetes during the weeks before surgery. . Make sure that the doctor who takes care of your diabetes knows about your planned surgery including the date and location.  How do I manage my blood sugar before surgery? . Check your blood sugar at least 4 times a day, starting 2 days before surgery, to make sure that  the level is not too high or low. o Check your blood sugar the morning of your surgery when you wake up and every 2 hours until you get to the Short Stay unit. . If your blood sugar is less than 70 mg/dL, you will need to treat for low blood sugar: o Do not take insulin. o Treat a low blood sugar (less than 70 mg/dL) with  cup of clear juice (cranberry or apple), 4 glucose tablets, OR glucose gel. o Recheck blood sugar in 15 minutes after treatment (to make sure it is greater than 70 mg/dL). If your blood sugar is not greater than 70 mg/dL on recheck, call [phone] for further instructions. . Report your blood sugar to the short stay nurse when you get to Short Stay.  . If you are admitted to the hospital after surgery: o Your blood sugar will be checked by the staff and you will probably be given insulin after surgery (instead of oral diabetes medicines) to make sure you have good blood sugar levels. o The goal for blood sugar control after surgery is 80-180 mg/dL.     Do not wear jewelry, make-up or nail polish.  Do not wear lotions, powders, or perfumes, or deodorant.  Do not shave 48 hours prior to surgery.  Men may shave face and neck.  Do not bring valuables to the hospital.  [facility] is not responsible for any belongings or valuables.  Contacts, dentures  or bridgework may not be worn into surgery.  Leave your suitcase in the car.  After surgery it may be brought to your room.  For patients admitted to the hospital, discharge time will be determined by your treatment team.  Patients discharged the day of surgery will not be allowed to drive home.    [place]- Preparing For Surgery  Before surgery, you can play an important role. Because skin is not sterile, your skin needs to be as free of germs as possible. You can reduce the number of germs on your skin by washing with CHG (chlorahexidine gluconate) Soap before surgery.  CHG is an antiseptic cleaner which kills germs  and bonds with the skin to continue killing germs even after washing.    Oral Hygiene is also important to reduce your risk of infection.  Remember - BRUSH YOUR TEETH THE MORNING OF SURGERY WITH YOUR REGULAR TOOTHPASTE  Please do not use if you have an allergy to CHG or antibacterial soaps. If your skin becomes reddened/irritated stop using the CHG.  Do not shave (including legs and underarms) for at least 48 hours prior to first CHG shower. It is OK to shave your face.  Please follow these instructions carefully.   1. Shower the NIGHT BEFORE SURGERY and the MORNING OF SURGERY with CHG.   2. If you chose to wash your hair, wash your hair first as usual with your normal shampoo.  3. After you shampoo, rinse your hair and body thoroughly to remove the shampoo.  4. Use CHG as you would any other liquid soap. You can apply CHG directly to the skin and wash gently with a scrungie or a clean washcloth.   5. Apply the CHG Soap to your body ONLY FROM THE NECK DOWN.  Do not use on open wounds or open sores. Avoid contact with your eyes, ears, mouth and genitals (private parts). Wash Face and genitals (private parts)  with your normal soap.  6. Wash thoroughly, paying special attention to the area where your surgery will be performed.  7. Thoroughly rinse your body with warm water from the neck down.  8. DO NOT shower/wash with your normal soap after using and rinsing off the CHG Soap.  9. Pat yourself dry with a CLEAN TOWEL.  10. Wear CLEAN PAJAMAS to bed the night before surgery, wear comfortable clothes the morning of surgery  11. Place CLEAN SHEETS on your bed the night of your first shower and DO NOT SLEEP WITH PETS.    Day of Surgery:  Do not apply any deodorants/lotions.  Please wear clean clothes to the hospital/surgery center.   Remember to brush your teeth WITH YOUR REGULAR TOOTHPASTE.    Please read over the following fact sheets that you were given.

## 2018-03-12 ENCOUNTER — Encounter (HOSPITAL_COMMUNITY): Payer: Self-pay

## 2018-03-12 ENCOUNTER — Other Ambulatory Visit: Payer: Self-pay

## 2018-03-12 ENCOUNTER — Encounter (HOSPITAL_COMMUNITY)
Admission: RE | Admit: 2018-03-12 | Discharge: 2018-03-12 | Disposition: A | Discharge status: Home / Self Care | Payer: BC Managed Care – PPO | Source: Ambulatory Visit | Attending: Orthopaedic Surgery | Admitting: Orthopaedic Surgery

## 2018-03-12 DIAGNOSIS — I10 Essential (primary) hypertension: Secondary | ICD-10-CM | POA: Insufficient documentation

## 2018-03-12 DIAGNOSIS — Z01818 Encounter for other preprocedural examination: Secondary | ICD-10-CM | POA: Diagnosis not present

## 2018-03-12 LAB — COMPREHENSIVE METABOLIC PANEL
ALBUMIN: 3.7 g/dL (ref 3.5–5.0)
ALK PHOS: 49 U/L (ref 38–126)
ALT: 14 U/L (ref 0–44)
AST: 21 U/L (ref 15–41)
Anion gap: 8 (ref 5–15)
BUN: 25 mg/dL — AB (ref 8–23)
CALCIUM: 9.2 mg/dL (ref 8.9–10.3)
CO2: 23 mmol/L (ref 22–32)
Chloride: 106 mmol/L (ref 98–111)
Creatinine, Ser: 0.97 mg/dL (ref 0.44–1.00)
GFR calc Af Amer: >60 mL/min (ref >60)
GFR calc non Af Amer: >60 mL/min (ref >60)
GLUCOSE: 104 mg/dL — AB (ref 70–99)
Potassium: 3.8 mmol/L (ref 3.5–5.1)
SODIUM: 137 mmol/L (ref 135–145)
Total Bilirubin: 0.7 mg/dL (ref 0.3–1.2)
Total Protein: 7.2 g/dL (ref 6.5–8.1)

## 2018-03-12 LAB — CBC
HCT: 33.6 % — ABNORMAL LOW (ref 36.0–46.0)
Hemoglobin: 10.6 g/dL — ABNORMAL LOW (ref 12.0–15.0)
MCH: 31 pg (ref 26.0–34.0)
MCHC: 31.5 g/dL (ref 30.0–36.0)
MCV: 98.2 fL (ref 78.0–100.0)
Platelets: 216 10*3/uL (ref 150–400)
RBC: 3.42 MIL/uL — ABNORMAL LOW (ref 3.87–5.11)
RDW: 15.1 % (ref 11.5–15.5)
WBC: 5.5 10*3/uL (ref 4.0–10.5)

## 2018-03-12 LAB — GLUCOSE, CAPILLARY: GLUCOSE-CAPILLARY: 101 mg/dL — AB (ref 70–99)

## 2018-03-12 LAB — SURGICAL PCR SCREEN
MRSA, PCR: NEGATIVE
STAPHYLOCOCCUS AUREUS: NEGATIVE

## 2018-03-12 NOTE — Progress Notes (Signed)
PCP - Seward Carol MD  EKG - 03/12/18  Fasting Blood Sugar -  100 Checks Blood Sugar : pt doesn't check  Aspirin Instructions: Pt has stopped taking already  Anesthesia review: none  Patient denies shortness of breath, fever, cough and chest pain at PAT appointment   Patient verbalized understanding of instructions that were given to them at the PAT appointment. Patient was also instructed that they will need to review over the PAT instructions again at home before surgery.

## 2018-03-18 NOTE — Anesthesia Preprocedure Evaluation (Addendum)
Anesthesia Evaluation  Patient identified by MRN, date of birth, ID band Patient awake    Reviewed: Allergy & Precautions, H&P , NPO status , Patient's Chart, lab work & pertinent test results  Airway Mallampati: II  TM Distance: >3 FB Neck ROM: Full    Dental no notable dental hx. (+) Teeth Intact, Dental Advisory Given   Pulmonary neg pulmonary ROS,    Pulmonary exam normal breath sounds clear to auscultation       Cardiovascular Exercise Tolerance: Good hypertension, Pt. on medications  Rhythm:Regular Rate:Normal     Neuro/Psych negative neurological ROS  negative psych ROS   GI/Hepatic negative GI ROS, Neg liver ROS,   Endo/Other  diabetes, Type 2, Oral Hypoglycemic AgentsMorbid obesity  Renal/GU negative Renal ROS  negative genitourinary   Musculoskeletal   Abdominal   Peds  Hematology negative hematology ROS (+)   Anesthesia Other Findings   Reproductive/Obstetrics negative OB ROS                            Anesthesia Physical Anesthesia Plan  ASA: III  Anesthesia Plan: General   Post-op Pain Management:  Regional for Post-op pain   Induction: Intravenous  PONV Risk Score and Plan: 3 and Ondansetron, Midazolam and Diphenhydramine  Airway Management Planned: Oral ETT  Additional Equipment:   Intra-op Plan:   Post-operative Plan: Extubation in OR  Informed Consent: I have reviewed the patients History and Physical, chart, labs and discussed the procedure including the risks, benefits and alternatives for the proposed anesthesia with the patient or authorized representative who has indicated his/her understanding and acceptance.   Dental advisory given  Plan Discussed with: CRNA  Anesthesia Plan Comments:         Anesthesia Quick Evaluation

## 2018-03-19 ENCOUNTER — Inpatient Hospital Stay (HOSPITAL_COMMUNITY): Payer: BC Managed Care – PPO

## 2018-03-19 ENCOUNTER — Encounter (HOSPITAL_COMMUNITY): Payer: Self-pay | Admitting: Certified Registered Nurse Anesthetist

## 2018-03-19 ENCOUNTER — Other Ambulatory Visit: Payer: Self-pay

## 2018-03-19 ENCOUNTER — Encounter (HOSPITAL_COMMUNITY)
Admission: RE | Disposition: A | Discharge status: Home / Self Care | Payer: Self-pay | Source: Ambulatory Visit | Attending: Orthopaedic Surgery

## 2018-03-19 ENCOUNTER — Ambulatory Visit (HOSPITAL_COMMUNITY): Payer: BC Managed Care – PPO | Admitting: Certified Registered Nurse Anesthetist

## 2018-03-19 ENCOUNTER — Observation Stay (HOSPITAL_COMMUNITY)
Admission: RE | Admit: 2018-03-19 | Discharge: 2018-03-20 | Disposition: A | Discharge status: Home / Self Care | Payer: BC Managed Care – PPO | Source: Ambulatory Visit | Attending: Orthopaedic Surgery | Admitting: Orthopaedic Surgery

## 2018-03-19 DIAGNOSIS — M25712 Osteophyte, left shoulder: Secondary | ICD-10-CM | POA: Diagnosis not present

## 2018-03-19 DIAGNOSIS — E785 Hyperlipidemia, unspecified: Secondary | ICD-10-CM | POA: Insufficient documentation

## 2018-03-19 DIAGNOSIS — Z7984 Long term (current) use of oral hypoglycemic drugs: Secondary | ICD-10-CM | POA: Diagnosis not present

## 2018-03-19 DIAGNOSIS — I10 Essential (primary) hypertension: Secondary | ICD-10-CM | POA: Diagnosis not present

## 2018-03-19 DIAGNOSIS — Z6841 Body Mass Index (BMI) 40.0 and over, adult: Secondary | ICD-10-CM | POA: Diagnosis not present

## 2018-03-19 DIAGNOSIS — Z7982 Long term (current) use of aspirin: Secondary | ICD-10-CM | POA: Diagnosis not present

## 2018-03-19 DIAGNOSIS — Z79899 Other long term (current) drug therapy: Secondary | ICD-10-CM | POA: Diagnosis not present

## 2018-03-19 DIAGNOSIS — M65812 Other synovitis and tenosynovitis, left shoulder: Secondary | ICD-10-CM | POA: Diagnosis not present

## 2018-03-19 DIAGNOSIS — E119 Type 2 diabetes mellitus without complications: Secondary | ICD-10-CM | POA: Diagnosis not present

## 2018-03-19 DIAGNOSIS — M810 Age-related osteoporosis without current pathological fracture: Secondary | ICD-10-CM | POA: Diagnosis not present

## 2018-03-19 DIAGNOSIS — M19012 Primary osteoarthritis, left shoulder: Principal | ICD-10-CM | POA: Diagnosis present

## 2018-03-19 DIAGNOSIS — Z09 Encounter for follow-up examination after completed treatment for conditions other than malignant neoplasm: Secondary | ICD-10-CM

## 2018-03-19 HISTORY — DX: Nausea with vomiting, unspecified: R11.2

## 2018-03-19 HISTORY — PX: TOTAL SHOULDER ARTHROPLASTY: SHX126

## 2018-03-19 HISTORY — DX: Adverse effect of unspecified anesthetic, initial encounter: T41.45XA

## 2018-03-19 HISTORY — DX: Other complications of anesthesia, initial encounter: T88.59XA

## 2018-03-19 HISTORY — DX: Other specified postprocedural states: Z98.890

## 2018-03-19 LAB — GLUCOSE, CAPILLARY
GLUCOSE-CAPILLARY: 97 mg/dL (ref 70–99)
GLUCOSE-CAPILLARY: 121 mg/dL — AB (ref 70–99)

## 2018-03-19 SURGERY — ARTHROPLASTY, SHOULDER, TOTAL
Anesthesia: General | Laterality: Left

## 2018-03-19 MED ORDER — DIPHENHYDRAMINE HCL 12.5 MG/5ML PO ELIX: 12.5000 mg | ORAL_SOLUTION | ORAL | Status: DC | PRN

## 2018-03-19 MED ORDER — DEXAMETHASONE SODIUM PHOSPHATE 10 MG/ML IJ SOLN
INTRAMUSCULAR | Status: AC
  Filled 2018-03-19: qty 1

## 2018-03-19 MED ORDER — CHLORHEXIDINE GLUCONATE 4 % EX LIQD: 60.0000 mL | Freq: Once | CUTANEOUS | Status: DC

## 2018-03-19 MED ORDER — BUPIVACAINE-EPINEPHRINE (PF) 0.25% -1:200000 IJ SOLN
INTRAMUSCULAR | Status: AC
  Filled 2018-03-19: qty 30

## 2018-03-19 MED ORDER — PIOGLITAZONE HCL 45 MG PO TABS
45.0000 mg | ORAL_TABLET | Freq: Every day | ORAL | Status: DC
  Administered 2018-03-20: 45 mg via ORAL
  Filled 2018-03-19 (×2): qty 1

## 2018-03-19 MED ORDER — VANCOMYCIN HCL 1000 MG IV SOLR
INTRAVENOUS | Status: AC
  Filled 2018-03-19: qty 1000

## 2018-03-19 MED ORDER — SODIUM CHLORIDE 0.9 % IR SOLN
Status: DC | PRN
  Administered 2018-03-19: 3000 mL

## 2018-03-19 MED ORDER — ROCURONIUM BROMIDE 50 MG/5ML IV SOSY
PREFILLED_SYRINGE | INTRAVENOUS | Status: AC
  Filled 2018-03-19: qty 5

## 2018-03-19 MED ORDER — CEFAZOLIN SODIUM-DEXTROSE 1-4 GM/50ML-% IV SOLN
1.0000 g | Freq: Four times a day (QID) | INTRAVENOUS | Status: AC
  Administered 2018-03-19 – 2018-03-20 (×3): 1 g via INTRAVENOUS
  Filled 2018-03-19 (×3): qty 50

## 2018-03-19 MED ORDER — DOCUSATE SODIUM 100 MG PO CAPS
100.0000 mg | ORAL_CAPSULE | Freq: Two times a day (BID) | ORAL | Status: DC
  Administered 2018-03-19 – 2018-03-20 (×2): 100 mg via ORAL
  Filled 2018-03-19 (×2): qty 1

## 2018-03-19 MED ORDER — LISINOPRIL-HYDROCHLOROTHIAZIDE 20-25 MG PO TABS: 1.0000 | ORAL_TABLET | Freq: Every day | ORAL | Status: DC

## 2018-03-19 MED ORDER — ONDANSETRON HCL 4 MG/2ML IJ SOLN
INTRAMUSCULAR | Status: DC | PRN
  Administered 2018-03-19: 4 mg via INTRAVENOUS

## 2018-03-19 MED ORDER — SODIUM CHLORIDE 0.9 % IV SOLN
INTRAVENOUS | Status: DC | PRN
  Administered 2018-03-19: 50 ug/min via INTRAVENOUS

## 2018-03-19 MED ORDER — HYDROMORPHONE HCL 1 MG/ML IJ SOLN
INTRAMUSCULAR | Status: AC
  Filled 2018-03-19: qty 1

## 2018-03-19 MED ORDER — METFORMIN HCL 500 MG PO TABS
500.0000 mg | ORAL_TABLET | Freq: Every day | ORAL | Status: DC
  Administered 2018-03-20: 500 mg via ORAL
  Filled 2018-03-19: qty 1

## 2018-03-19 MED ORDER — EZETIMIBE 10 MG PO TABS
10.0000 mg | ORAL_TABLET | Freq: Every day | ORAL | Status: DC
  Administered 2018-03-20: 10 mg via ORAL
  Filled 2018-03-19: qty 1

## 2018-03-19 MED ORDER — ONDANSETRON HCL 4 MG/2ML IJ SOLN
INTRAMUSCULAR | Status: AC
  Filled 2018-03-19: qty 2

## 2018-03-19 MED ORDER — TRANEXAMIC ACID 1000 MG/10ML IV SOLN
1000.0000 mg | INTRAVENOUS | Status: AC
  Administered 2018-03-19: 1000 mg via INTRAVENOUS
  Filled 2018-03-19: qty 1000

## 2018-03-19 MED ORDER — PROPOFOL 10 MG/ML IV BOLUS
INTRAVENOUS | Status: AC
  Filled 2018-03-19: qty 20

## 2018-03-19 MED ORDER — 0.9 % SODIUM CHLORIDE (POUR BTL) OPTIME
TOPICAL | Status: DC | PRN
  Administered 2018-03-19: 1000 mL

## 2018-03-19 MED ORDER — METOCLOPRAMIDE HCL 5 MG/ML IJ SOLN: 5.0000 mg | Freq: Three times a day (TID) | INTRAMUSCULAR | Status: DC | PRN

## 2018-03-19 MED ORDER — PHENYLEPHRINE 40 MCG/ML (10ML) SYRINGE FOR IV PUSH (FOR BLOOD PRESSURE SUPPORT)
PREFILLED_SYRINGE | INTRAVENOUS | Status: AC
  Filled 2018-03-19: qty 10

## 2018-03-19 MED ORDER — HYDROCHLOROTHIAZIDE 25 MG PO TABS
25.0000 mg | ORAL_TABLET | Freq: Every day | ORAL | Status: DC
  Administered 2018-03-20: 25 mg via ORAL
  Filled 2018-03-19: qty 1

## 2018-03-19 MED ORDER — LISINOPRIL 20 MG PO TABS
20.0000 mg | ORAL_TABLET | Freq: Every day | ORAL | Status: DC
  Administered 2018-03-20: 20 mg via ORAL
  Filled 2018-03-19: qty 1

## 2018-03-19 MED ORDER — EPHEDRINE SULFATE-NACL 50-0.9 MG/10ML-% IV SOSY
PREFILLED_SYRINGE | INTRAVENOUS | Status: DC | PRN
  Administered 2018-03-19: 10 mg via INTRAVENOUS

## 2018-03-19 MED ORDER — METOCLOPRAMIDE HCL 5 MG PO TABS: 5.0000 mg | ORAL_TABLET | Freq: Three times a day (TID) | ORAL | Status: DC | PRN

## 2018-03-19 MED ORDER — EPHEDRINE 5 MG/ML INJ
INTRAVENOUS | Status: AC
  Filled 2018-03-19: qty 10

## 2018-03-19 MED ORDER — FENTANYL CITRATE (PF) 100 MCG/2ML IJ SOLN
INTRAMUSCULAR | Status: DC | PRN
  Administered 2018-03-19: 50 ug via INTRAVENOUS
  Administered 2018-03-19 (×2): 25 ug via INTRAVENOUS
  Administered 2018-03-19 (×3): 50 ug via INTRAVENOUS

## 2018-03-19 MED ORDER — OXYCODONE HCL 5 MG PO TABS
5.0000 mg | ORAL_TABLET | ORAL | Status: DC | PRN
  Administered 2018-03-19 – 2018-03-20 (×3): 5 mg via ORAL
  Administered 2018-03-20: 10 mg via ORAL
  Filled 2018-03-19: qty 2
  Filled 2018-03-19 (×3): qty 1

## 2018-03-19 MED ORDER — HYDROMORPHONE HCL 1 MG/ML IJ SOLN
0.2500 mg | INTRAMUSCULAR | Status: DC | PRN
  Administered 2018-03-19 (×2): 0.5 mg via INTRAVENOUS

## 2018-03-19 MED ORDER — DEXAMETHASONE SODIUM PHOSPHATE 10 MG/ML IJ SOLN
INTRAMUSCULAR | Status: DC | PRN
  Administered 2018-03-19: 5 mg via INTRAVENOUS

## 2018-03-19 MED ORDER — PHENYLEPHRINE 40 MCG/ML (10ML) SYRINGE FOR IV PUSH (FOR BLOOD PRESSURE SUPPORT)
PREFILLED_SYRINGE | INTRAVENOUS | Status: DC | PRN
  Administered 2018-03-19: 80 ug via INTRAVENOUS
  Administered 2018-03-19: 120 ug via INTRAVENOUS
  Administered 2018-03-19: 40 ug via INTRAVENOUS
  Administered 2018-03-19: 80 ug via INTRAVENOUS

## 2018-03-19 MED ORDER — BUPIVACAINE HCL (PF) 0.5 % IJ SOLN
INTRAMUSCULAR | Status: DC | PRN
  Administered 2018-03-19: 15 mL via PERINEURAL

## 2018-03-19 MED ORDER — BUPIVACAINE-EPINEPHRINE 0.25% -1:200000 IJ SOLN
INTRAMUSCULAR | Status: AC
  Filled 2018-03-19: qty 1

## 2018-03-19 MED ORDER — ACETAMINOPHEN 500 MG PO TABS
1000.0000 mg | ORAL_TABLET | Freq: Three times a day (TID) | ORAL | Status: DC
  Administered 2018-03-19 – 2018-03-20 (×3): 1000 mg via ORAL
  Filled 2018-03-19 (×4): qty 2

## 2018-03-19 MED ORDER — VANCOMYCIN HCL IN DEXTROSE 1-5 GM/200ML-% IV SOLN
INTRAVENOUS | Status: AC
  Filled 2018-03-19: qty 200

## 2018-03-19 MED ORDER — HYDROMORPHONE HCL 1 MG/ML IJ SOLN: 0.5000 mg | INTRAMUSCULAR | Status: DC | PRN

## 2018-03-19 MED ORDER — ZOLPIDEM TARTRATE 5 MG PO TABS: 5.0000 mg | ORAL_TABLET | Freq: Every evening | ORAL | Status: DC | PRN

## 2018-03-19 MED ORDER — MIDAZOLAM HCL 2 MG/2ML IJ SOLN
INTRAMUSCULAR | Status: AC
  Filled 2018-03-19: qty 2

## 2018-03-19 MED ORDER — SUGAMMADEX SODIUM 200 MG/2ML IV SOLN
INTRAVENOUS | Status: DC | PRN
  Administered 2018-03-19: 300 mg via INTRAVENOUS

## 2018-03-19 MED ORDER — ROCURONIUM BROMIDE 50 MG/5ML IV SOSY
PREFILLED_SYRINGE | INTRAVENOUS | Status: DC | PRN
  Administered 2018-03-19: 30 mg via INTRAVENOUS
  Administered 2018-03-19: 50 mg via INTRAVENOUS

## 2018-03-19 MED ORDER — LIDOCAINE 2% (20 MG/ML) 5 ML SYRINGE
INTRAMUSCULAR | Status: AC
  Filled 2018-03-19: qty 5

## 2018-03-19 MED ORDER — PROPOFOL 10 MG/ML IV BOLUS
INTRAVENOUS | Status: DC | PRN
  Administered 2018-03-19: 30 mg via INTRAVENOUS
  Administered 2018-03-19: 50 mg via INTRAVENOUS
  Administered 2018-03-19: 100 mg via INTRAVENOUS

## 2018-03-19 MED ORDER — ONDANSETRON HCL 4 MG PO TABS: 4.0000 mg | ORAL_TABLET | Freq: Four times a day (QID) | ORAL | Status: DC | PRN

## 2018-03-19 MED ORDER — ASPIRIN EC 81 MG PO TBEC
81.0000 mg | DELAYED_RELEASE_TABLET | Freq: Every day | ORAL | Status: DC
  Administered 2018-03-19 – 2018-03-20 (×2): 81 mg via ORAL
  Filled 2018-03-19 (×2): qty 1

## 2018-03-19 MED ORDER — FENTANYL CITRATE (PF) 250 MCG/5ML IJ SOLN
INTRAMUSCULAR | Status: AC
  Filled 2018-03-19: qty 5

## 2018-03-19 MED ORDER — ONDANSETRON HCL 4 MG/2ML IJ SOLN
4.0000 mg | Freq: Four times a day (QID) | INTRAMUSCULAR | Status: DC | PRN
  Administered 2018-03-20: 4 mg via INTRAVENOUS
  Filled 2018-03-19: qty 2

## 2018-03-19 MED ORDER — LACTATED RINGERS IV SOLN
INTRAVENOUS | Status: DC | PRN
  Administered 2018-03-19 (×2): via INTRAVENOUS

## 2018-03-19 MED ORDER — BUPIVACAINE LIPOSOME 1.3 % IJ SUSP
INTRAMUSCULAR | Status: DC | PRN
  Administered 2018-03-19: 10 mL via PERINEURAL

## 2018-03-19 MED ORDER — CEFAZOLIN SODIUM-DEXTROSE 2-4 GM/100ML-% IV SOLN
2.0000 g | INTRAVENOUS | Status: AC
  Administered 2018-03-19: 2 g via INTRAVENOUS
  Filled 2018-03-19: qty 100

## 2018-03-19 MED ORDER — MIDAZOLAM HCL 2 MG/2ML IJ SOLN
INTRAMUSCULAR | Status: DC | PRN
  Administered 2018-03-19: 2 mg via INTRAVENOUS

## 2018-03-19 SURGICAL SUPPLY — 76 items
AID PSTN UNV HD RSTRNT DISP (MISCELLANEOUS) ×1
APL SKNCLS STERI-STRIP NONHPOA (GAUZE/BANDAGES/DRESSINGS) ×1
BENZOIN TINCTURE PRP APPL 2/3 (GAUZE/BANDAGES/DRESSINGS) ×3 IMPLANT
BLADE SAW SAG 29X58X.64 (BLADE) IMPLANT
BLADE SAW SAG 73X25 THK (BLADE) ×2
BLADE SAW SGTL 73X25 THK (BLADE) IMPLANT
CEMENT BONE DEPUY (Cement) ×2 IMPLANT
CHLORAPREP W/TINT 26ML (MISCELLANEOUS) ×6 IMPLANT
CLOSURE STERI-STRIP 1/2X4 (GAUZE/BANDAGES/DRESSINGS) ×1
CLOSURE WOUND 1/2 X4 (GAUZE/BANDAGES/DRESSINGS) ×1
CLSR STERI-STRIP ANTIMIC 1/2X4 (GAUZE/BANDAGES/DRESSINGS) ×1 IMPLANT
COVER SURGICAL LIGHT HANDLE (MISCELLANEOUS) ×3 IMPLANT
COVER WAND RF STERILE (DRAPES) ×3 IMPLANT
DRAPE C-ARM 42X72 X-RAY (DRAPES) IMPLANT
DRAPE INCISE IOBAN 66X45 STRL (DRAPES) ×4 IMPLANT
DRAPE ORTHO SPLIT 77X108 STRL (DRAPES) ×6
DRAPE PROXIMA HALF (DRAPES) ×3 IMPLANT
DRAPE SURG ORHT 6 SPLT 77X108 (DRAPES) ×2 IMPLANT
DRAPE SWITCH (DRAPES) ×3 IMPLANT
DRESSING AQUACEL AG SP 3.5X4 (GAUZE/BANDAGES/DRESSINGS) IMPLANT
DRSG AQUACEL AG ADV 3.5X 6 (GAUZE/BANDAGES/DRESSINGS) ×3 IMPLANT
DRSG AQUACEL AG SP 3.5X4 (GAUZE/BANDAGES/DRESSINGS) ×3
ELECT BLADE 4.0 EZ CLEAN MEGAD (MISCELLANEOUS)
ELECT CAUTERY BLADE 6.4 (BLADE) ×3 IMPLANT
ELECT REM PT RETURN 9FT ADLT (ELECTROSURGICAL) ×3
ELECTRODE BLDE 4.0 EZ CLN MEGD (MISCELLANEOUS) ×1 IMPLANT
ELECTRODE REM PT RTRN 9FT ADLT (ELECTROSURGICAL) ×1 IMPLANT
GLENOID PEG SHOULDER 40MM SML (Shoulder) IMPLANT
GLOVE BIOGEL PI IND STRL 8 (GLOVE) ×1 IMPLANT
GLOVE BIOGEL PI INDICATOR 8 (GLOVE) ×2
GLOVE ECLIPSE 8.0 STRL XLNG CF (GLOVE) ×6 IMPLANT
GOWN STRL REUS W/ TWL LRG LVL3 (GOWN DISPOSABLE) ×1 IMPLANT
GOWN STRL REUS W/ TWL XL LVL3 (GOWN DISPOSABLE) ×1 IMPLANT
GOWN STRL REUS W/TWL LRG LVL3 (GOWN DISPOSABLE) ×3
GOWN STRL REUS W/TWL XL LVL3 (GOWN DISPOSABLE) ×3
GUIDEWIRE GLENOID 2.5X220 (WIRE) ×2 IMPLANT
HANDPIECE INTERPULSE COAX TIP (DISPOSABLE) ×3
HEAD HUM 1.5XLO OFST 15X41 (Stem) IMPLANT
HEAD HUM AEQUALIS 41X15 (Stem) ×3 IMPLANT
KIT BASIN OR (CUSTOM PROCEDURE TRAY) ×3 IMPLANT
KIT STABILIZATION SHOULDER (MISCELLANEOUS) ×3 IMPLANT
KIT TURNOVER KIT B (KITS) ×3 IMPLANT
MANIFOLD NEPTUNE II (INSTRUMENTS) ×3 IMPLANT
NDL HYPO 25GX1X1/2 BEV (NEEDLE) IMPLANT
NDL MAYO TROCAR (NEEDLE) ×1 IMPLANT
NEEDLE HYPO 25GX1X1/2 BEV (NEEDLE) IMPLANT
NEEDLE MAYO TROCAR (NEEDLE) ×3 IMPLANT
NS IRRIG 1000ML POUR BTL (IV SOLUTION) ×3 IMPLANT
PACK SHOULDER (CUSTOM PROCEDURE TRAY) ×3 IMPLANT
PAD ARMBOARD 7.5X6 YLW CONV (MISCELLANEOUS) ×6 IMPLANT
RESTRAINT HEAD UNIVERSAL NS (MISCELLANEOUS) ×3 IMPLANT
SET HNDPC FAN SPRY TIP SCT (DISPOSABLE) IMPLANT
SHOULDER GLENOID PEG 40MM SML (Shoulder) ×3 IMPLANT
SLING ARM IMMOBILIZER LRG (SOFTGOODS) ×2 IMPLANT
SMARTMIX MINI TOWER (MISCELLANEOUS) ×3
SPONGE LAP 18X18 X RAY DECT (DISPOSABLE) ×3 IMPLANT
STEM HUMERAL PTC STND SZ2A (Joint) ×3 IMPLANT
STEM HUMERAL PTCSTD SZ2A (Joint) IMPLANT
STRIP CLOSURE SKIN 1/2X4 (GAUZE/BANDAGES/DRESSINGS) ×2 IMPLANT
SUCTION FRAZIER HANDLE 10FR (MISCELLANEOUS) ×2
SUCTION TUBE FRAZIER 10FR DISP (MISCELLANEOUS) ×1 IMPLANT
SUT ETHIBOND 2 V 37 (SUTURE) ×3 IMPLANT
SUT ETHIBOND NAB CT1 #1 30IN (SUTURE) ×3 IMPLANT
SUT FIBERWIRE #5 38 CONV NDL (SUTURE) ×18
SUT MON AB 3-0 SH 27 (SUTURE) ×3
SUT MON AB 3-0 SH27 (SUTURE) ×1 IMPLANT
SUT VIC AB 0 CT1 18XCR BRD 8 (SUTURE) ×1 IMPLANT
SUT VIC AB 0 CT1 8-18 (SUTURE) ×3
SUT VIC AB 2-0 CT1 27 (SUTURE) ×3
SUT VIC AB 2-0 CT1 TAPERPNT 27 (SUTURE) ×1 IMPLANT
SUTURE FIBERWR #5 38 CONV NDL (SUTURE) ×6 IMPLANT
TOWEL OR 17X26 10 PK STRL BLUE (TOWEL DISPOSABLE) ×3 IMPLANT
TOWER CARTRIDGE SMART MIX (DISPOSABLE) ×2 IMPLANT
TOWER SMARTMIX MINI (MISCELLANEOUS) IMPLANT
TRAY FOLEY W/BAG SLVR 14FR (SET/KITS/TRAYS/PACK) IMPLANT
WATER STERILE IRR 1000ML POUR (IV SOLUTION) ×1 IMPLANT

## 2018-03-19 NOTE — Op Note (Signed)
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  Patient had complete grade 4 cartilage loss on the posterior aspect of the humeral head as well as on the posterior 60% of the glenoid.  There is significant cystic changes requiring bone grafting of the central vault but we had perfect fixation of her poly at the end of the case.  Post-operative plan: The patient will be nonweightbearing in a sling.  The patient will be admitted overnight observation.  DVT prophylaxis not indicated in this ambulatory upper extremity patient with no previous history of clot.  Pain control with PRN pain medication preferring oral medicines.  Follow up plan will be scheduled in approximately 7 days for incision check and XR.  Post-Op Diagnosis: Same Surgeons:Primary: Hiram Gash, MD Assistants: Joya Gaskins, OPAC Location: [facility] OR ROOM 06 Anesthesia: Choice Antibiotics: Ancef 2g preop Tourniquet time: * No tourniquets in log * Estimated Blood Loss: 517 Complications: None Specimens: None Implants: Implant Name Type Inv. Item Serial No. Manufacturer Lot No. LRB No. Used Action  CEMENT BONE DEPUY - [id number] Cement CEMENT BONE DEPUY  DEPUY SYNTHES [id number] Left 1 Implanted  SHOULDER GLENOID PEG 40MM SML - [id number] Shoulder SHOULDER GLENOID PEG 40MM SML  [employer] [id number] Left 1 Implanted  HEAD HUM AEQUALIS 41X15 - [id number] Stem HEAD HUM AEQUALIS 41X15  [employer] [id number] Left 1 Implanted  STEM HUMERAL PTC STND SZ2A - [id number] Joint STEM HUMERAL PTC STND [employer] [id number] Left 1 Implanted    Indications for Surgery:   Faith Perez is a 62 y.o. 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  X-rays and MRI were performed which demonstrated  some continued cartilage in the anterior portion of the glenoid however there was significant cystic change and bony edema with complete loss of the posterior glenoid cartilage.  We tried for greater than 6 months to make sure the patient nonoperatively however she had an intact cuff and continued pain in the setting of cystic changes and arthritis and elected to proceed with total shoulder arthroscopy.  Benefits and risks of operative and nonoperative management were discussed prior to surgery with patient/guardian(s) and informed consent form was completed.  Specific risks including infection, need for additional surgery, to need pain, component loosening, periprosthetic fracture, need for revision to reverse total shoulder arthroplasty, rotator cuff deficiency.    Procedure:   The patient underwent a placement of a left interscalene regional block by the anesthesia service.  she was brought to the operating suite and placed in a supine position on the operating table. General endotracheal anesthesia was administered. Patient was given 2 g of cefazolin intravenously.  she was repositioned into a modified beachchair position. Care was taken to pad all bony prominences and her head was placed in a neutral position. The right upper extremity was prepped and draped in standard sterile fashion.    Standard deltopectoral approach was performed with a #10 blade. We dissected down to the subcutaneous tissues and the cephalic vein was taken laterally with the deltoid. Clavipectoral fascia was incised in line with the incision. Deep retractors were placed. The long of the biceps tendon was identified and there was significant tenosynovitis present.  Tenodesis was performed to the pectoralis tendon with #2 Ethibond. The remaining biceps was followed  up into the rotator interval where it was released. The subscapularis was taken down with a lesser tuberosity osteotomy using an osteotome. The osteotomy fragment and  underlying capsular elevated off of the humeral neck and the osteophytes inferiorly. #5 Ethibond sutures are passed through the bone tendon junction for later subscap repair. We continued releasing the capsule directly off of the osteophytes inferiorly all the way around the corner. This allowed [place] to dislocate the humeral head. The humeral head had evidence of severe osteoarthritic wear with full-thickness cartilage loss and exposed subchondral bone posteriorly though there was some retained cartilage anteriorly.   The rotator cuff was carefully examined and noted to be intact without sign of wear.  The decision was confirmed that an anatomic total shoulder was indicated for this patient.  There were osteophytes along the inferior humeral neck. The osteophytes were removed with an osteotome and a rongeur.  Osteophytes were removed with a rongeur and an osteotome and the anatomic neck was well visualized.   We next made our humeral osteotomy with an oscillating saw along the anatomic neck. The head fragment was passed off the back table and measured approximately 41 mm in diameter. A starter awl was used to open the humeral canal. We reamed with T-handle sound  reamers up an appropriate fit. The chisel was used to remove proximal humeral bone. We then broached starting with a size 1 broach increasing to size to which had secure and stable fit. The inclination was set at A. The broach handle was removed and a cut protector was placed. The humerus was retracted posteriorly and we turned our attention to glenoid exposure.  The subscapularis was again identified and immediately we took care to palpate the axillary nerve anteriorly and verify its position with gentle palpation as well as the tug test.  We then released the SGHL with bovie cautery prior to placing a curved mayo at the junction of the anterior glenoid well above the axillary nerve and bluntly dissecting the subscapularis from the capsule.  We then  carefully protected the axillary nerve as we gently released the inferior capsule to fully mobilize the subscapularis.  An anterior deltoid retractor was then placed as well as a small Hohmann retractor superiorly.    The glenoid was inspected and had evidence of severe osteoarthritic wear with full-thickness cartilage loss and exposed subchondral bone.  Significant and there was complete loss of cartilage posteriorly with exposed cystic bone.  The remaining labrum was removed circumferentially taking great care not to disrupt the posterior capsule. The glenoid was sized as listed in the implants above. The center hole was marked and we used a glenoid drill guide to place a guide pin in the center position. The glenoid was reamed concentrically over the guide pin. Next the center hole was enlarged and the drill guide for the peripheral pegs was placed.  We noted that there is cystic change in the central peg hole was amenable to grafting and we used autograft to fill this hole.  The peripheral pegs were drilled in standard fashion. A trial glenoid was placed and was stable. We removed the trial components.   The glenoid bone was prepared with pulsatile lavage and dried with the sponge and suction. Cement was mixed on the back table the peripheral peg holes were cemented. The glenoid was impacted securely we placed autograft bone on the central peg to encourage growth in the setting of cystic changes.   We turned attention back to the humeral  side. The cut protector was removed. We trialed with multiple size head options and selected a 41 low offset which re-created the patient's anatomy. The offset was dialed in to match the normal anatomy. The shoulder was trialed.  There was good ROM in all planes and the shoulder was stable with approximately 50% posterior spring back and no inferior translation.  The real humeral implants were opened on the back table and assembled.  The trial was removed. #5 FiberWire  sutures passed through the humeral neck for subscap repair. The humeral component was press-fit obtaining a secure fit.  The joint was reduced and thoroughly irrigated with pulsatile lavage. Subscap and lesser tuberosity osteotomy repaired back in a double row fashion with #5 FiberWire sutures through bone tunnels. Next the rotator interval was closed with #2 ethibond suture. Hemostasis was obtained. The deltopectoral interval was reapproximated with #1 Ethibond. The subcutaneous tissues were closed with 2-0 Vicryl and the skin was closed with a Monocryl.   The surgeons were cleaned and dried and an Aquacel dressing was placed. The drapes taken down. The arm was placed into sling with abduction pillow. Patient was awakened, extubated, and transferred to the recovery room in stable condition. There were no intraoperative complications. The sponge, needle, and attention counts were  correct at the end of the case.    Joya Gaskins, OPA-C, present and scrubbed throughout the case, critical for completion in a timely fashion, and for retraction, instrumentation, closure.

## 2018-03-19 NOTE — Anesthesia Procedure Notes (Signed)
Anesthesia Regional Block: Interscalene brachial plexus block   Pre-Anesthetic Checklist: ,, timeout performed, Correct Patient, Correct Site, Correct Laterality, Correct Procedure, Correct Position, site marked, Risks and benefits discussed, pre-op evaluation,  At surgeon's request and post-op pain management  Laterality: Left  Prep: Maximum Sterile Barrier Precautions used, chloraprep       Needles:  Injection technique: Single-shot  Needle Type: Echogenic Stimulator Needle     Needle Length: 5cm  Needle Gauge: 22     Additional Needles:   Procedures:,,,, ultrasound used (permanent image in chart),,,,  Narrative:  Start time: 03/19/2018 8:14 AM End time: 03/19/2018 8:24 AM Injection made incrementally with aspirations every 5 mL. Anesthesiologist: Roderic Palau, MD  Additional Notes: 2% Lidocaine skin wheel.

## 2018-03-19 NOTE — Anesthesia Procedure Notes (Signed)
Procedure Name: Intubation Date/Time: 03/19/2018 8:38 AM Performed by: Genelle Bal, CRNA Pre-anesthesia Checklist: Patient identified, Emergency Drugs available, Suction available and Patient being monitored Patient Re-evaluated:Patient Re-evaluated prior to induction Oxygen Delivery Method: Circle system utilized Preoxygenation: Pre-oxygenation with 100% oxygen Induction Type: IV induction Ventilation: Mask ventilation without difficulty Laryngoscope Size: Miller and 2 Grade View: Grade I Tube type: Oral Tube size: 7.0 mm Number of attempts: 1 Airway Equipment and Method: Stylet and Oral airway Placement Confirmation: ETT inserted through vocal cords under direct vision,  positive ETCO2 and breath sounds checked- equal and bilateral Secured at: 21 cm Tube secured with: Tape Dental Injury: Teeth and Oropharynx as per pre-operative assessment

## 2018-03-19 NOTE — Progress Notes (Signed)
Orthopedic Tech Progress Note Patient Details:  Faith Perez 04-26-1956 [id number]  Ortho Devices Type of Ortho Device: Shoulder abduction pillow Ortho Device/Splint Location: lue Ortho Device/Splint Interventions: Application   Post Interventions Patient Tolerated: Well Instructions Provided: Care of device As ordered by Dr. Ferdinand Lango, [place] 03/19/2018, 11:46 AM

## 2018-03-19 NOTE — Progress Notes (Signed)
1215 Received pt from PACU, asleep, easy to arouse, falls back to sleep easily. Left shoulder incision with Aquacel dressing dry and intact.

## 2018-03-19 NOTE — H&P (Signed)
PREOPERATIVE H&P  Chief Complaint: djd left shoulder  HPI: Faith Perez is a 62 y.o. female who presents for preoperative history and physical with a diagnosis of djd left shoulder. Symptoms are rated as moderate to severe, and have been worsening.  This is significantly impairing activities of daily living.  Please see my clinic note for full details on this patient's care.  She has elected for surgical management.   Past Medical History:  Diagnosis Date  . Diabetes mellitus without complication ([place])   . Hyperlipidemia   . Hypertension    Past Surgical History:  Procedure Laterality Date  . ABDOMINAL HYSTERECTOMY    . COLONOSCOPY    . HERNIA REPAIR     Social History   Socioeconomic History  . Marital status: Married    Spouse name: Not on file  . Number of children: Not on file  . Years of education: Not on file  . Highest education level: Not on file  Occupational History  . Occupation: employed    Comment: works in school; Data processing manager  . Financial resource strain: Not on file  . Food insecurity:    Worry: Not on file    Inability: Not on file  . Transportation needs:    Medical: Not on file    Non-medical: Not on file  Tobacco Use  . Smoking status: Never Smoker  . Smokeless tobacco: Never Used  Substance and Sexual Activity  . Alcohol use: Yes    Comment: rare social  . Drug use: Never  . Sexual activity: Not on file  Lifestyle  . Physical activity:    Days per week: Not on file    Minutes per session: Not on file  . Stress: Not on file  Relationships  . Social connections:    Talks on phone: Not on file    Gets together: Not on file    Attends religious service: Not on file    Active member of club or organization: Not on file    Attends meetings of clubs or organizations: Not on file    Relationship status: Not on file  Other Topics Concern  . Not on file  Social History Narrative  . Not on file   Family History  Problem Relation  Age of Onset  . Heart failure Mother   . Hypertension Mother   . Diabetes Sister   . Diabetes Sister    No Known Allergies Prior to Admission medications   Medication Sig Start Date End Date Taking? Authorizing Provider  aspirin 81 MG tablet Take 81 mg by mouth daily.   Yes [provider]  ezetimibe (ZETIA) 10 MG tablet Take 10 mg by mouth daily.   Yes [provider]  lisinopril-hydrochlorothiazide (PRINZIDE,ZESTORETIC) 20-25 MG per tablet Take 1 tablet by mouth daily.   Yes [provider]  metFORMIN (GLUCOPHAGE) 500 MG tablet Take 500 mg by mouth daily with breakfast.    Yes [provider]  pioglitazone (ACTOS) 45 MG tablet Take 45 mg by mouth daily.   Yes [provider]  cefdinir (OMNICEF) 300 MG capsule Take 2 capsules (600 mg total) by mouth daily. Patient not taking: Reported on 03/10/2018 12/11/14   Darlyne Russian, MD  chlorpheniramine-HYDROcodone [facility] PENNKINETIC ER) 10-8 MG/5ML LQCR Take 5 mLs by mouth every 12 (twelve) hours as needed. Patient not taking: Reported on 12/11/2014 11/09/12   Wardell Honour, MD  chlorpheniramine-HYDROcodone [facility] PENNKINETIC ER) 10-8 MG/5ML SUER Take 1/2-1 teaspoon  every 12 hours Patient not taking: Reported on 03/10/2018 12/11/14   Darlyne Russian, MD     Positive ROS: All other systems have been reviewed and were otherwise negative with the exception of those mentioned in the HPI and as above.  Physical Exam: General: Alert, no acute distress Cardiovascular: No pedal edema Respiratory: No cyanosis, no use of accessory musculature GI: No organomegaly, abdomen is soft and non-tender Skin: No lesions in the area of chief complaint Neurologic: Sensation intact distally Psychiatric: Patient is competent for consent with normal mood and affect Lymphatic: No axillary or cervical lymphadenopathy  MUSCULOSKELETAL: L shoulder exquisite pain with ROM, cuff intact  MRI demonstrates significant OA with  atypical cystic formation in glenoid.  Assessment: djd left shoulder  Plan: Plan for Procedure(s): TOTAL SHOULDER ARTHROPLASTY  The risks benefits and alternatives were discussed with the patient including but not limited to the risks of nonoperative treatment, versus surgical intervention including infection, bleeding, nerve injury,  blood clots, cardiopulmonary complications, morbidity, mortality, among others, and they were willing to proceed.   Hiram Gash, MD  03/19/2018 7:17 AM

## 2018-03-19 NOTE — Transfer of Care (Signed)
Immediate Anesthesia Transfer of Care Note  Patient: Faith Perez  Procedure(s) Performed: TOTAL SHOULDER ARTHROPLASTY (Left )  Patient Location: PACU  Anesthesia Type:GA combined with regional for post-op pain  Level of Consciousness: awake, alert  and oriented  Airway & Oxygen Therapy: Patient Spontanous Breathing and Patient connected to face mask oxygen  Post-op Assessment: Report given to RN and Post -op Vital signs reviewed and stable  Post vital signs: Reviewed and stable  Last Vitals:  Vitals Value Taken Time  BP 104/53 03/19/2018 10:42 AM  Temp    Pulse 64 03/19/2018 10:42 AM  Resp 13 03/19/2018 10:42 AM  SpO2 95 % 03/19/2018 10:42 AM  Vitals shown include unvalidated device data.  Last Pain:  Vitals:   03/19/18 0728  TempSrc: Oral  PainSc:       Patients Stated Pain Goal: 2 (33/43/56 8616)  Complications: No apparent anesthesia complications

## 2018-03-19 NOTE — Anesthesia Postprocedure Evaluation (Signed)
Anesthesia Post Note  Patient: Faith Perez  Procedure(s) Performed: TOTAL SHOULDER ARTHROPLASTY (Left )     Patient location during evaluation: PACU Anesthesia Type: General and Regional Level of consciousness: awake and alert Pain management: pain level controlled Vital Signs Assessment: post-procedure vital signs reviewed and stable Respiratory status: spontaneous breathing, nonlabored ventilation and respiratory function stable Cardiovascular status: blood pressure returned to baseline and stable Postop Assessment: no apparent nausea or vomiting Anesthetic complications: no    Last Vitals:  Vitals:   03/19/18 1200 03/19/18 1221  BP: 101/61 (!) 107/57  Pulse: 63 (!) 52  Resp: 17 18  Temp: (!) 36.1 C (!) 36.2 C  SpO2: 97% 100%    Last Pain:  Vitals:   03/19/18 1221  TempSrc: Oral  PainSc:                  Adell Koval,W. EDMOND

## 2018-03-20 ENCOUNTER — Encounter (HOSPITAL_COMMUNITY): Payer: Self-pay | Admitting: Orthopaedic Surgery

## 2018-03-20 DIAGNOSIS — M19012 Primary osteoarthritis, left shoulder: Secondary | ICD-10-CM | POA: Diagnosis not present

## 2018-03-20 LAB — GLUCOSE, CAPILLARY: Glucose-Capillary: 135 mg/dL — ABNORMAL HIGH (ref 70–99)

## 2018-03-20 MED ORDER — OXYCODONE HCL 5 MG PO TABS: ORAL_TABLET | ORAL | 0 refills | Status: AC

## 2018-03-20 MED ORDER — ONDANSETRON HCL 4 MG PO TABS: 4.0000 mg | ORAL_TABLET | Freq: Three times a day (TID) | ORAL | 1 refills | Status: AC | PRN

## 2018-03-20 MED ORDER — MELOXICAM 7.5 MG PO TABS: 7.5000 mg | ORAL_TABLET | Freq: Every day | ORAL | 2 refills | Status: AC

## 2018-03-20 MED ORDER — ACETAMINOPHEN 500 MG PO TABS: 1000.0000 mg | ORAL_TABLET | Freq: Three times a day (TID) | ORAL | 0 refills | Status: AC

## 2018-03-20 NOTE — Progress Notes (Signed)
Provided discharge education/instructions, all questions and concerns addressed, discharged home with belongings accompanied by daughter.

## 2018-03-20 NOTE — Plan of Care (Signed)
  Problem: Education: Goal: Knowledge of General Education information will improve Description Including pain rating scale, medication(s)/side effects and non-pharmacologic comfort measures Outcome: Progressing   

## 2018-03-20 NOTE — Discharge Summary (Signed)
Patient ID: Faith Perez MRN: [id number] DOB/AGE: December 30, 1955 62 y.o.  Admit date: 03/19/2018 Discharge date: 03/20/2018  Admission Diagnoses:L shoulder arthritis  Discharge Diagnoses:  Active Problems:   Degenerative arthritis of left shoulder region   Past Medical History:  Diagnosis Date  . Complication of anesthesia   . Diabetes mellitus without complication ([place])   . Hyperlipidemia   . Hypertension   . PONV (postoperative nausea and vomiting)      Procedures Performed: L total shoulder arthroplasty  Discharged Condition: good  Hospital Course: Patient brought in as an outpatient for surgery.  Tolerated procedure well.  Was kept for monitoring overnight for pain control and medical monitoring postop and was found to be stable for DC home the morning after surgery.  Patient was instructed on specific activity restrictions and all questions were answered.   Consults: None  Significant Diagnostic Studies: No additional pertinent studies  Treatments: Surgery  Discharge Exam:  Dressing CDI and sling well fitting,  full and painless ROM throughout hand with DPC of 0. + Motor in  AIN, PIN, Ulnar distributions. Axillary nerve sensation preserved and symmetric.  Sensation intact in medial, radial, and ulnar distributions. Well perfused digits.     Disposition: Discharge disposition: 01-Home or Self Care       Discharge Instructions    Call MD for:  persistant nausea and vomiting   Complete by:  As directed    Call MD for:  redness, tenderness, or signs of infection (pain, swelling, redness, odor or green/yellow discharge around incision site)   Complete by:  As directed    Call MD for:  severe uncontrolled pain   Complete by:  As directed    Diet - low sodium heart healthy   Complete by:  As directed    Discharge instructions   Complete by:  As directed    Ophelia Charter MD, MPH [place]. [address] [phone] (tel)     [phone] (fax)   [place] may leave the operative dressing in place until your follow-up appointment. KEEP THE INCISIONS CLEAN AND DRY. Use the Cryocuff, GameReady or Ice as often as possible for the first 3-4 days, then as needed for pain relief.  You may shower on Post-Op Day #2. The dressing is water resistant but do not scrub it as it may start to peel up.  You may remove the sling for showering, but keep a water resistant pillow under the arm to keep both the elbow and shoulder away from the body (mimicking the abduction sling). Gently pat the area dry. Do not soak the shoulder in water. Do not go swimming in the pool or ocean until your sutures are removed.  EXERCISES Wear the sling at all times except when doing your exercises. You may remove the sling for showering, but keep the arm across the chest or in a secondary sling.   Accidental/Purposeful External Rotation and shoulder flexion (reaching behind you) is to be avoided at all costs for the first month. Please perform the exercises:   Elbow / Hand / Wrist  Range of Motion Exercises POST-OP A multi-modal approach will be used to treat your pain. Oxycodone - This is a strong narcotic, to be used only on an "as needed" basis for pain. Meloxicam- An anti-inflammatory medication Acetaminophen - A non-narcotic pain medicine.  Use 1000mg  three times a day for the first 14 days after surgery  If you have any adverse effects with the medications, please call our office.  FOLLOW-UP If you develop a Fever (>101.5), Redness or Drainage from the surgical incision site, please call our office to arrange for an evaluation. Please call the office to schedule a follow-up appointment for a wound check, 7-10 days post-operatively.    IF YOU HAVE ANY QUESTIONS, PLEASE FEEL FREE TO CALL OUR OFFICE.   HELPFUL INFORMATION  Your arm will be in a sling following surgery. You  will be in this sling for the next 3-4 weeks.  I will let you know the exact duration at your follow-up visit.  You may be more comfortable sleeping in a semi-seated position the first few nights following surgery.  Keep a pillow propped under the elbow and forearm for comfort.  If you have a recliner type of chair it might be beneficial.  If not that is fine too, but it would be helpful to sleep propped up with pillows behind your operated shoulder as well under your elbow and forearm.  This will reduce pulling on the suture lines.  We suggest you use the pain medication the first night prior to going to bed, in order to ease any pain when the anesthesia wears off. You should avoid taking pain medications on an empty stomach as it will make you nauseous.  Do not drink alcoholic beverages or take illicit drugs when taking pain medications.  In most states it is against the law to drive while your arm is in a sling. And certainly against the law to drive while taking narcotics.  You may return to work/school in the next couple of days when you feel up to it. Desk work and typing in the sling is fine.  When dressing, put your operative arm in the sleeve first.  When getting undressed, take your operative arm out last.  Loose fitting, button-down shirts are recommended.  Pain medication may make you constipated.  Below are a few solutions to try in this order: Decrease the amount of pain medication if you aren't having pain. Drink lots of decaffeinated fluids. Drink prune juice and/or each dried prunes  If the first 3 don't work start with additional solutions Take Colace - an over-the-counter stool softener Take Senokot - an over-the-counter laxative Take Miralax - a stronger over-the-counter laxative   Increase activity slowly   Complete by:  As directed      Allergies as of 03/20/2018   No Known Allergies     Medication List    STOP taking these medications   cefdinir 300 MG  capsule Commonly known as:  OMNICEF   chlorpheniramine-HYDROcodone 10-8 MG/5ML Lqcr Commonly known as:  TUSSIONEX   chlorpheniramine-HYDROcodone 10-8 MG/5ML Suer Commonly known as:  TUSSIONEX     TAKE these medications   acetaminophen 500 MG tablet Commonly known as:  TYLENOL Take 2 tablets (1,000 mg total) by mouth every 8 (eight) hours for 14 days.   aspirin 81 MG tablet Take 81 mg by mouth daily.   ezetimibe 10 MG tablet Commonly known as:  ZETIA Take 10 mg by mouth daily.   lisinopril-hydrochlorothiazide 20-25 MG tablet Commonly known as:  PRINZIDE,ZESTORETIC Take 1 tablet by mouth daily.   meloxicam 7.5 MG tablet Commonly known as:  MOBIC Take 1 tablet (7.5 mg total) by mouth daily.   metFORMIN 500 MG tablet Commonly known as:  GLUCOPHAGE Take 500 mg by mouth daily with breakfast.   ondansetron 4 MG tablet Commonly known as:  ZOFRAN Take 1 tablet (4 mg total) by mouth every 8 (eight) hours as needed for up to 7 days for nausea or vomiting.   oxyCODONE 5 MG immediate release tablet Commonly known as:  Oxy IR/ROXICODONE Take 1-2 pills every 6 hrs as needed for pain, no more than 6 per day   pioglitazone 45 MG tablet Commonly known as:  ACTOS Take 45 mg by mouth daily.

## 2018-03-20 NOTE — Plan of Care (Signed)

## 2018-03-20 NOTE — Evaluation (Signed)
Occupational Therapy Evaluation Patient Details Name: Faith Perez MRN: [id number] DOB: September 30, 1955 Today's Date: 03/20/2018    History of Present Illness Pt is a 62 y/o female s/p L total shoulder arthroplasty. PMH includes Diabetes mellitus without complication, Hyperlipidemia, and Hypertension.    Clinical Impression   PTA Pt requiring assist for ADL PRN from husband due to pain. Pt is currently max A for UB ADL - min A for BUE tasks like cutting food due to pain (10/10) this session. Educated on sling management/positioning, position for sleeping and bed mobility (maintaining NWB), showering, dressing, and ROM exercises as ordered by MD (please see shoulder section below). Pt and husband both verbalize understanding and had no questions at the end of session. Husband will be around 24/7 and able to assist (present throughout session). OT to defer further therapy to MD at follow up. Thank you for the opportunity to serve this patient.    Follow Up Recommendations  Follow surgeon's recommendation for DC plan and follow-up therapies    Equipment Recommendations  None recommended by OT    Recommendations for Other Services       Precautions / Restrictions Precautions Precautions: Shoulder Type of Shoulder Precautions: conservative Shoulder Interventions: Shoulder sling/immobilizer;At all times;Off for dressing/bathing/exercises Precaution Booklet Issued: Yes (comment) Precaution Comments: shoulder dc handout reviewed in full Required Braces or Orthoses: Sling Restrictions Weight Bearing Restrictions: Yes LUE Weight Bearing: Non weight bearing      Mobility Bed Mobility Overal bed mobility: Needs Assistance Bed Mobility: Supine to Sit;Sit to Supine     Supine to sit: Supervision Sit to supine: Supervision   General bed mobility comments: no attempt to push or pull with LUE  Transfers Overall transfer level: Needs assistance   Transfers: Sit to/from Stand Sit to Stand:  Min guard         General transfer comment: for safety    Balance                                           ADL either performed or assessed with clinical judgement   ADL                                         General ADL Comments: please see shoulder section below     Vision Patient Visual Report: No change from baseline       Perception     Praxis      Pertinent Vitals/Pain Pain Assessment: 0-10 Pain Score: 10-Worst pain ever Pain Location: L shoulder Pain Descriptors / Indicators: Constant;Grimacing;Operative site guarding;Sore;Throbbing Pain Intervention(s): Monitored during session;Ice applied;Limited activity within patient's tolerance     Hand Dominance     Extremity/Trunk Assessment Upper Extremity Assessment Upper Extremity Assessment: LUE deficits/detail LUE Deficits / Details: anticipated post-op deficits LUE Sensation: WNL LUE Coordination: decreased gross motor           Communication Communication Communication: No difficulties   Cognition Arousal/Alertness: Awake/alert Behavior During Therapy: WFL for tasks assessed/performed Overall Cognitive Status: Within Functional Limits for tasks assessed                                     General Comments  Exercises Exercises: Shoulder Shoulder Exercises Elbow Flexion: AROM;Left;Self ROM Elbow Extension: AROM;Self ROM;Left Wrist Flexion: AROM;Left Wrist Extension: AROM;Left Digit Composite Flexion: AROM;Left Composite Extension: AROM;Left Neck Flexion: AROM Neck Extension: AROM Neck Lateral Flexion - Right: AROM Neck Lateral Flexion - Left: AROM   Shoulder Instructions Shoulder Instructions Donning/doffing shirt without moving shoulder: Maximal assistance;Caregiver independent with task;Patient able to independently direct caregiver Method for sponge bathing under operated UE: Minimal assistance;Caregiver independent with  task;Patient able to independently direct caregiver Donning/doffing sling/immobilizer: Maximal assistance;Caregiver independent with task;Patient able to independently direct caregiver Correct positioning of sling/immobilizer: Maximal assistance;Caregiver independent with task;Patient able to independently direct caregiver ROM for elbow, wrist and digits of operated UE: Supervision/safety Sling wearing schedule (on at all times/off for ADL's): Independent Proper positioning of operated UE when showering: Min-guard;Caregiver independent with task;Patient able to independently direct caregiver Positioning of UE while sleeping: Maximal assistance;Caregiver independent with task;Patient able to independently direct caregiver    Home Living Family/patient expects to be discharged to:: Private residence Living Arrangements: Spouse/significant other Available Help at Discharge: Family;Available 24 hours/day Type of Home: House       Home Layout: One level     Bathroom Shower/Tub: Teacher, early years/pre: Standard     Home Equipment: None          Prior Functioning/Environment Level of Independence: Independent                 OT Problem List: Decreased range of motion;Decreased activity tolerance;Decreased knowledge of use of DME or AE;Decreased knowledge of precautions      OT Treatment/Interventions:      OT Goals(Current goals can be found in the care plan section) Acute Rehab OT Goals Patient Stated Goal: get back to "normal" and be able to do things like tie shoes and cook meals in kitchen OT Goal Formulation: With patient/family Time For Goal Achievement: 04/03/18 Potential to Achieve Goals: Good  OT Frequency:     Barriers to D/C:            Co-evaluation              AM-PAC PT "6 Clicks" Daily Activity     Outcome Measure Help from another person eating meals?: A Little Help from another person taking care of personal grooming?: A  Little Help from another person toileting, which includes using toliet, bedpan, or urinal?: A Little Help from another person bathing (including washing, rinsing, drying)?: A Little Help from another person to put on and taking off regular upper body clothing?: A Lot Help from another person to put on and taking off regular lower body clothing?: A Lot 6 Click Score: 16   End of Session Equipment Utilized During Treatment: Other (comment)(sling) Nurse Communication: Mobility status;Weight bearing status;Precautions  Activity Tolerance: Patient tolerated treatment well(despite pain) Patient left: in bed;with call bell/phone within reach;with family/visitor present  OT Visit Diagnosis: Pain Pain - Right/Left: Left Pain - part of body: Shoulder                Time: 3299-2426 OT Time Calculation (min): 25 min Charges:  OT General Charges $OT Visit: 1 Visit OT Evaluation $OT Eval Moderate Complexity: 1 Mod OT Treatments $Self Care/Home Management : 8-22 mins  Hulda Humphrey OTR/L Acute Rehabilitation Services Pager: [phone] Office: [place] 03/20/2018, 10:54 AM

## 2018-04-25 ENCOUNTER — Other Ambulatory Visit: Payer: Self-pay | Admitting: Internal Medicine

## 2018-04-25 DIAGNOSIS — Z1231 Encounter for screening mammogram for malignant neoplasm of breast: Secondary | ICD-10-CM

## 2018-05-20 ENCOUNTER — Encounter (INDEPENDENT_AMBULATORY_CARE_PROVIDER_SITE_OTHER): Payer: BC Managed Care – PPO | Admitting: Ophthalmology

## 2018-05-20 DIAGNOSIS — E113293 Type 2 diabetes mellitus with mild nonproliferative diabetic retinopathy without macular edema, bilateral: Secondary | ICD-10-CM | POA: Diagnosis not present

## 2018-05-20 DIAGNOSIS — H43813 Vitreous degeneration, bilateral: Secondary | ICD-10-CM

## 2018-05-20 DIAGNOSIS — E11319 Type 2 diabetes mellitus with unspecified diabetic retinopathy without macular edema: Secondary | ICD-10-CM | POA: Diagnosis not present

## 2018-05-20 DIAGNOSIS — H35033 Hypertensive retinopathy, bilateral: Secondary | ICD-10-CM | POA: Diagnosis not present

## 2018-05-20 DIAGNOSIS — I10 Essential (primary) hypertension: Secondary | ICD-10-CM

## 2018-05-20 DIAGNOSIS — H2513 Age-related nuclear cataract, bilateral: Secondary | ICD-10-CM

## 2018-06-05 ENCOUNTER — Ambulatory Visit
Admission: RE | Admit: 2018-06-05 | Discharge: 2018-06-05 | Disposition: A | Discharge status: Home / Self Care | Payer: BC Managed Care – PPO | Source: Ambulatory Visit | Attending: Internal Medicine | Admitting: Internal Medicine

## 2018-06-05 DIAGNOSIS — Z1231 Encounter for screening mammogram for malignant neoplasm of breast: Secondary | ICD-10-CM

## 2019-05-21 ENCOUNTER — Encounter (INDEPENDENT_AMBULATORY_CARE_PROVIDER_SITE_OTHER): Payer: BC Managed Care – PPO | Admitting: Ophthalmology

## 2019-09-24 ENCOUNTER — Ambulatory Visit: Payer: Medicare Other | Attending: Internal Medicine

## 2019-09-24 ENCOUNTER — Ambulatory Visit: Payer: Self-pay

## 2019-09-24 ENCOUNTER — Ambulatory Visit: Payer: Medicare Other

## 2019-12-09 IMAGING — DX DG SHOULDER 1V*L*
1 series · 1 of 1 positions shown · non-contrast
Comparison: None.

CLINICAL DATA: Status post left shoulder arthroplasty.

EXAM:
LEFT SHOULDER - 1 VIEW

[shoulder]
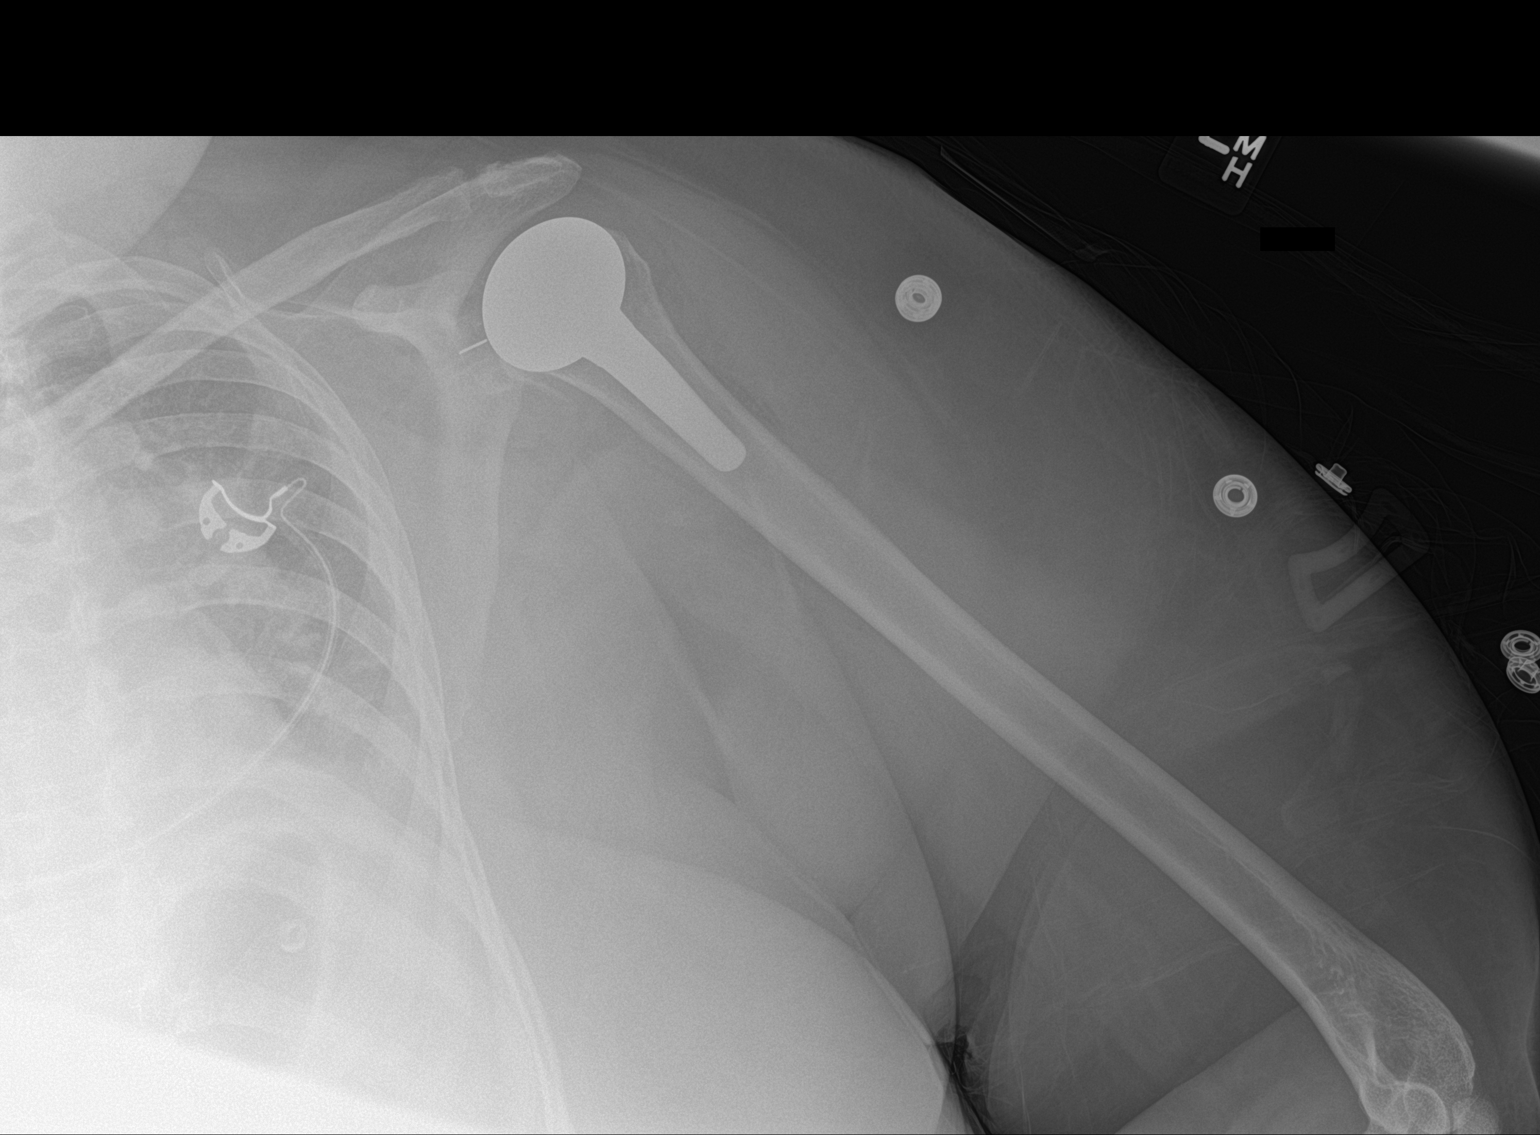

[1 of 1 positions shown; findings below may reference images not displayed]

FINDINGS: Left humeral prosthesis appears to be well situated. No fracture or
dislocation is noted. Visualized ribs appear normal.
IMPRESSION: Status post left shoulder arthroplasty.

## 2020-05-27 ENCOUNTER — Other Ambulatory Visit: Payer: Self-pay | Admitting: Internal Medicine

## 2020-05-27 DIAGNOSIS — Z1231 Encounter for screening mammogram for malignant neoplasm of breast: Secondary | ICD-10-CM

## 2020-06-02 ENCOUNTER — Other Ambulatory Visit: Payer: Self-pay

## 2020-06-02 ENCOUNTER — Encounter (INDEPENDENT_AMBULATORY_CARE_PROVIDER_SITE_OTHER): Payer: Medicare Other | Admitting: Ophthalmology

## 2020-06-02 DIAGNOSIS — H35033 Hypertensive retinopathy, bilateral: Secondary | ICD-10-CM | POA: Diagnosis not present

## 2020-06-02 DIAGNOSIS — H43813 Vitreous degeneration, bilateral: Secondary | ICD-10-CM

## 2020-06-02 DIAGNOSIS — E113311 Type 2 diabetes mellitus with moderate nonproliferative diabetic retinopathy with macular edema, right eye: Secondary | ICD-10-CM

## 2020-06-02 DIAGNOSIS — E113212 Type 2 diabetes mellitus with mild nonproliferative diabetic retinopathy with macular edema, left eye: Secondary | ICD-10-CM | POA: Diagnosis not present

## 2020-06-02 DIAGNOSIS — I10 Essential (primary) hypertension: Secondary | ICD-10-CM | POA: Diagnosis not present

## 2020-06-16 DIAGNOSIS — E119 Type 2 diabetes mellitus without complications: Secondary | ICD-10-CM | POA: Diagnosis not present

## 2020-06-16 DIAGNOSIS — E7800 Pure hypercholesterolemia, unspecified: Secondary | ICD-10-CM | POA: Diagnosis not present

## 2020-06-21 DIAGNOSIS — E7800 Pure hypercholesterolemia, unspecified: Secondary | ICD-10-CM | POA: Diagnosis not present

## 2020-06-21 DIAGNOSIS — E119 Type 2 diabetes mellitus without complications: Secondary | ICD-10-CM | POA: Diagnosis not present

## 2020-07-07 ENCOUNTER — Other Ambulatory Visit: Payer: Self-pay

## 2020-07-07 ENCOUNTER — Ambulatory Visit
Admission: RE | Admit: 2020-07-07 | Discharge: 2020-07-07 | Disposition: A | Discharge status: Home / Self Care | Payer: Medicare Other | Source: Ambulatory Visit | Attending: Internal Medicine | Admitting: Internal Medicine

## 2020-07-07 DIAGNOSIS — E1169 Type 2 diabetes mellitus with other specified complication: Secondary | ICD-10-CM | POA: Diagnosis not present

## 2020-07-07 DIAGNOSIS — E7800 Pure hypercholesterolemia, unspecified: Secondary | ICD-10-CM | POA: Diagnosis not present

## 2020-07-07 DIAGNOSIS — Z1231 Encounter for screening mammogram for malignant neoplasm of breast: Secondary | ICD-10-CM | POA: Diagnosis not present

## 2020-07-07 DIAGNOSIS — Z0000 Encounter for general adult medical examination without abnormal findings: Secondary | ICD-10-CM | POA: Diagnosis not present

## 2020-07-07 DIAGNOSIS — I10 Essential (primary) hypertension: Secondary | ICD-10-CM | POA: Diagnosis not present

## 2020-07-07 DIAGNOSIS — Z79899 Other long term (current) drug therapy: Secondary | ICD-10-CM | POA: Diagnosis not present

## 2020-07-07 DIAGNOSIS — Z7984 Long term (current) use of oral hypoglycemic drugs: Secondary | ICD-10-CM | POA: Diagnosis not present

## 2020-11-30 DIAGNOSIS — E7800 Pure hypercholesterolemia, unspecified: Secondary | ICD-10-CM | POA: Diagnosis not present

## 2020-11-30 DIAGNOSIS — E119 Type 2 diabetes mellitus without complications: Secondary | ICD-10-CM | POA: Diagnosis not present

## 2020-11-30 DIAGNOSIS — E782 Mixed hyperlipidemia: Secondary | ICD-10-CM | POA: Diagnosis not present

## 2020-11-30 DIAGNOSIS — I10 Essential (primary) hypertension: Secondary | ICD-10-CM | POA: Diagnosis not present

## 2020-12-21 DIAGNOSIS — I10 Essential (primary) hypertension: Secondary | ICD-10-CM | POA: Diagnosis not present

## 2020-12-21 DIAGNOSIS — E1122 Type 2 diabetes mellitus with diabetic chronic kidney disease: Secondary | ICD-10-CM | POA: Diagnosis not present

## 2020-12-21 DIAGNOSIS — Z789 Other specified health status: Secondary | ICD-10-CM | POA: Diagnosis not present

## 2020-12-21 DIAGNOSIS — N1831 Chronic kidney disease, stage 3a: Secondary | ICD-10-CM | POA: Diagnosis not present

## 2020-12-21 DIAGNOSIS — E7800 Pure hypercholesterolemia, unspecified: Secondary | ICD-10-CM | POA: Diagnosis not present

## 2020-12-21 DIAGNOSIS — Z7984 Long term (current) use of oral hypoglycemic drugs: Secondary | ICD-10-CM | POA: Diagnosis not present

## 2021-02-07 DIAGNOSIS — E782 Mixed hyperlipidemia: Secondary | ICD-10-CM | POA: Diagnosis not present

## 2021-02-07 DIAGNOSIS — E1169 Type 2 diabetes mellitus with other specified complication: Secondary | ICD-10-CM | POA: Diagnosis not present

## 2021-02-07 DIAGNOSIS — I10 Essential (primary) hypertension: Secondary | ICD-10-CM | POA: Diagnosis not present

## 2021-02-08 DIAGNOSIS — N1831 Chronic kidney disease, stage 3a: Secondary | ICD-10-CM | POA: Diagnosis not present

## 2021-02-08 DIAGNOSIS — I10 Essential (primary) hypertension: Secondary | ICD-10-CM | POA: Diagnosis not present

## 2021-02-08 DIAGNOSIS — E119 Type 2 diabetes mellitus without complications: Secondary | ICD-10-CM | POA: Diagnosis not present

## 2021-02-08 DIAGNOSIS — E782 Mixed hyperlipidemia: Secondary | ICD-10-CM | POA: Diagnosis not present

## 2021-02-08 DIAGNOSIS — E7800 Pure hypercholesterolemia, unspecified: Secondary | ICD-10-CM | POA: Diagnosis not present

## 2021-02-08 DIAGNOSIS — E1122 Type 2 diabetes mellitus with diabetic chronic kidney disease: Secondary | ICD-10-CM | POA: Diagnosis not present

## 2021-05-26 DIAGNOSIS — E7800 Pure hypercholesterolemia, unspecified: Secondary | ICD-10-CM | POA: Diagnosis not present

## 2021-05-26 DIAGNOSIS — E1122 Type 2 diabetes mellitus with diabetic chronic kidney disease: Secondary | ICD-10-CM | POA: Diagnosis not present

## 2021-05-26 DIAGNOSIS — N1831 Chronic kidney disease, stage 3a: Secondary | ICD-10-CM | POA: Diagnosis not present

## 2021-05-26 DIAGNOSIS — I10 Essential (primary) hypertension: Secondary | ICD-10-CM | POA: Diagnosis not present

## 2021-05-26 DIAGNOSIS — E139 Other specified diabetes mellitus without complications: Secondary | ICD-10-CM | POA: Diagnosis not present

## 2021-05-30 ENCOUNTER — Encounter (INDEPENDENT_AMBULATORY_CARE_PROVIDER_SITE_OTHER): Payer: Medicare Other | Admitting: Ophthalmology

## 2021-05-30 ENCOUNTER — Other Ambulatory Visit: Payer: Self-pay

## 2021-05-30 DIAGNOSIS — E113292 Type 2 diabetes mellitus with mild nonproliferative diabetic retinopathy without macular edema, left eye: Secondary | ICD-10-CM | POA: Diagnosis not present

## 2021-05-30 DIAGNOSIS — E113391 Type 2 diabetes mellitus with moderate nonproliferative diabetic retinopathy without macular edema, right eye: Secondary | ICD-10-CM | POA: Diagnosis not present

## 2021-05-30 DIAGNOSIS — H43813 Vitreous degeneration, bilateral: Secondary | ICD-10-CM

## 2021-05-30 DIAGNOSIS — I10 Essential (primary) hypertension: Secondary | ICD-10-CM

## 2021-05-30 DIAGNOSIS — H35033 Hypertensive retinopathy, bilateral: Secondary | ICD-10-CM | POA: Diagnosis not present

## 2021-05-30 DIAGNOSIS — D3131 Benign neoplasm of right choroid: Secondary | ICD-10-CM

## 2021-07-18 DIAGNOSIS — G720 Drug-induced myopathy: Secondary | ICD-10-CM | POA: Diagnosis not present

## 2021-07-18 DIAGNOSIS — Z0000 Encounter for general adult medical examination without abnormal findings: Secondary | ICD-10-CM | POA: Diagnosis not present

## 2021-07-18 DIAGNOSIS — E7800 Pure hypercholesterolemia, unspecified: Secondary | ICD-10-CM | POA: Diagnosis not present

## 2021-07-18 DIAGNOSIS — I10 Essential (primary) hypertension: Secondary | ICD-10-CM | POA: Diagnosis not present

## 2021-07-18 DIAGNOSIS — E1122 Type 2 diabetes mellitus with diabetic chronic kidney disease: Secondary | ICD-10-CM | POA: Diagnosis not present

## 2021-07-18 DIAGNOSIS — Z7984 Long term (current) use of oral hypoglycemic drugs: Secondary | ICD-10-CM | POA: Diagnosis not present

## 2021-07-18 DIAGNOSIS — N1831 Chronic kidney disease, stage 3a: Secondary | ICD-10-CM | POA: Diagnosis not present

## 2021-07-18 DIAGNOSIS — D649 Anemia, unspecified: Secondary | ICD-10-CM | POA: Diagnosis not present

## 2021-08-02 DIAGNOSIS — R7989 Other specified abnormal findings of blood chemistry: Secondary | ICD-10-CM | POA: Diagnosis not present

## 2021-08-08 ENCOUNTER — Other Ambulatory Visit: Payer: Self-pay | Admitting: Internal Medicine

## 2021-08-08 DIAGNOSIS — Z1231 Encounter for screening mammogram for malignant neoplasm of breast: Secondary | ICD-10-CM

## 2021-08-17 ENCOUNTER — Ambulatory Visit: Payer: Medicare Other

## 2021-11-28 ENCOUNTER — Encounter (INDEPENDENT_AMBULATORY_CARE_PROVIDER_SITE_OTHER): Payer: Medicare Other | Admitting: Ophthalmology

## 2021-11-28 DIAGNOSIS — H35033 Hypertensive retinopathy, bilateral: Secondary | ICD-10-CM

## 2021-11-28 DIAGNOSIS — E113291 Type 2 diabetes mellitus with mild nonproliferative diabetic retinopathy without macular edema, right eye: Secondary | ICD-10-CM | POA: Diagnosis not present

## 2021-11-28 DIAGNOSIS — H43813 Vitreous degeneration, bilateral: Secondary | ICD-10-CM

## 2021-11-28 DIAGNOSIS — E113392 Type 2 diabetes mellitus with moderate nonproliferative diabetic retinopathy without macular edema, left eye: Secondary | ICD-10-CM | POA: Diagnosis not present

## 2021-11-28 DIAGNOSIS — I10 Essential (primary) hypertension: Secondary | ICD-10-CM

## 2022-01-16 DIAGNOSIS — G720 Drug-induced myopathy: Secondary | ICD-10-CM | POA: Diagnosis not present

## 2022-01-16 DIAGNOSIS — I10 Essential (primary) hypertension: Secondary | ICD-10-CM | POA: Diagnosis not present

## 2022-01-16 DIAGNOSIS — E11319 Type 2 diabetes mellitus with unspecified diabetic retinopathy without macular edema: Secondary | ICD-10-CM | POA: Diagnosis not present

## 2022-01-16 DIAGNOSIS — E1122 Type 2 diabetes mellitus with diabetic chronic kidney disease: Secondary | ICD-10-CM | POA: Diagnosis not present

## 2022-01-16 DIAGNOSIS — N1831 Chronic kidney disease, stage 3a: Secondary | ICD-10-CM | POA: Diagnosis not present

## 2022-01-16 DIAGNOSIS — E7800 Pure hypercholesterolemia, unspecified: Secondary | ICD-10-CM | POA: Diagnosis not present

## 2022-05-22 ENCOUNTER — Encounter (INDEPENDENT_AMBULATORY_CARE_PROVIDER_SITE_OTHER): Payer: Medicare Other | Admitting: Ophthalmology

## 2022-05-22 DIAGNOSIS — H43813 Vitreous degeneration, bilateral: Secondary | ICD-10-CM | POA: Diagnosis not present

## 2022-05-22 DIAGNOSIS — E113293 Type 2 diabetes mellitus with mild nonproliferative diabetic retinopathy without macular edema, bilateral: Secondary | ICD-10-CM | POA: Diagnosis not present

## 2022-05-22 DIAGNOSIS — I10 Essential (primary) hypertension: Secondary | ICD-10-CM | POA: Diagnosis not present

## 2022-05-22 DIAGNOSIS — H35033 Hypertensive retinopathy, bilateral: Secondary | ICD-10-CM | POA: Diagnosis not present

## 2022-05-22 DIAGNOSIS — H35372 Puckering of macula, left eye: Secondary | ICD-10-CM

## 2022-06-28 DIAGNOSIS — M25511 Pain in right shoulder: Secondary | ICD-10-CM | POA: Diagnosis not present

## 2022-06-28 DIAGNOSIS — N1831 Chronic kidney disease, stage 3a: Secondary | ICD-10-CM | POA: Diagnosis not present

## 2022-06-28 DIAGNOSIS — E11319 Type 2 diabetes mellitus with unspecified diabetic retinopathy without macular edema: Secondary | ICD-10-CM | POA: Diagnosis not present

## 2022-06-28 DIAGNOSIS — E1122 Type 2 diabetes mellitus with diabetic chronic kidney disease: Secondary | ICD-10-CM | POA: Diagnosis not present

## 2022-07-17 DIAGNOSIS — H25811 Combined forms of age-related cataract, right eye: Secondary | ICD-10-CM | POA: Diagnosis not present

## 2022-07-17 DIAGNOSIS — E113293 Type 2 diabetes mellitus with mild nonproliferative diabetic retinopathy without macular edema, bilateral: Secondary | ICD-10-CM | POA: Diagnosis not present

## 2022-10-12 DIAGNOSIS — H25811 Combined forms of age-related cataract, right eye: Secondary | ICD-10-CM | POA: Diagnosis not present

## 2022-10-26 DIAGNOSIS — H25812 Combined forms of age-related cataract, left eye: Secondary | ICD-10-CM | POA: Diagnosis not present

## 2022-11-21 ENCOUNTER — Encounter (INDEPENDENT_AMBULATORY_CARE_PROVIDER_SITE_OTHER): Payer: Medicare Other | Admitting: Ophthalmology

## 2022-11-21 DIAGNOSIS — Z7984 Long term (current) use of oral hypoglycemic drugs: Secondary | ICD-10-CM | POA: Diagnosis not present

## 2022-11-21 DIAGNOSIS — E113292 Type 2 diabetes mellitus with mild nonproliferative diabetic retinopathy without macular edema, left eye: Secondary | ICD-10-CM | POA: Diagnosis not present

## 2022-11-21 DIAGNOSIS — H35033 Hypertensive retinopathy, bilateral: Secondary | ICD-10-CM | POA: Diagnosis not present

## 2022-11-21 DIAGNOSIS — H43813 Vitreous degeneration, bilateral: Secondary | ICD-10-CM | POA: Diagnosis not present

## 2022-11-21 DIAGNOSIS — E113391 Type 2 diabetes mellitus with moderate nonproliferative diabetic retinopathy without macular edema, right eye: Secondary | ICD-10-CM

## 2022-11-21 DIAGNOSIS — I10 Essential (primary) hypertension: Secondary | ICD-10-CM | POA: Diagnosis not present

## 2023-01-04 DIAGNOSIS — E1122 Type 2 diabetes mellitus with diabetic chronic kidney disease: Secondary | ICD-10-CM | POA: Diagnosis not present

## 2023-01-04 DIAGNOSIS — D649 Anemia, unspecified: Secondary | ICD-10-CM | POA: Diagnosis not present

## 2023-01-04 DIAGNOSIS — Z0000 Encounter for general adult medical examination without abnormal findings: Secondary | ICD-10-CM | POA: Diagnosis not present

## 2023-01-04 DIAGNOSIS — I10 Essential (primary) hypertension: Secondary | ICD-10-CM | POA: Diagnosis not present

## 2023-01-04 DIAGNOSIS — E11319 Type 2 diabetes mellitus with unspecified diabetic retinopathy without macular edema: Secondary | ICD-10-CM | POA: Diagnosis not present

## 2023-01-04 DIAGNOSIS — E7800 Pure hypercholesterolemia, unspecified: Secondary | ICD-10-CM | POA: Diagnosis not present

## 2023-01-04 DIAGNOSIS — Z23 Encounter for immunization: Secondary | ICD-10-CM | POA: Diagnosis not present

## 2023-01-04 DIAGNOSIS — N1831 Chronic kidney disease, stage 3a: Secondary | ICD-10-CM | POA: Diagnosis not present

## 2023-01-11 ENCOUNTER — Other Ambulatory Visit: Payer: Self-pay | Admitting: Internal Medicine

## 2023-01-11 DIAGNOSIS — E2839 Other primary ovarian failure: Secondary | ICD-10-CM

## 2023-01-30 ENCOUNTER — Other Ambulatory Visit: Payer: Self-pay | Admitting: Internal Medicine

## 2023-01-30 DIAGNOSIS — Z1231 Encounter for screening mammogram for malignant neoplasm of breast: Secondary | ICD-10-CM

## 2023-02-21 ENCOUNTER — Ambulatory Visit
Admission: RE | Admit: 2023-02-21 | Discharge: 2023-02-21 | Disposition: A | Discharge status: Home / Self Care | Payer: Medicare Other | Source: Ambulatory Visit | Attending: Internal Medicine | Admitting: Internal Medicine

## 2023-02-21 DIAGNOSIS — Z1231 Encounter for screening mammogram for malignant neoplasm of breast: Secondary | ICD-10-CM | POA: Diagnosis not present

## 2023-03-25 DIAGNOSIS — M79671 Pain in right foot: Secondary | ICD-10-CM | POA: Diagnosis not present

## 2023-03-25 DIAGNOSIS — M79673 Pain in unspecified foot: Secondary | ICD-10-CM | POA: Diagnosis not present

## 2023-07-12 DIAGNOSIS — Z23 Encounter for immunization: Secondary | ICD-10-CM | POA: Diagnosis not present

## 2023-07-12 DIAGNOSIS — E7800 Pure hypercholesterolemia, unspecified: Secondary | ICD-10-CM | POA: Diagnosis not present

## 2023-07-12 DIAGNOSIS — N1831 Chronic kidney disease, stage 3a: Secondary | ICD-10-CM | POA: Diagnosis not present

## 2023-07-12 DIAGNOSIS — I10 Essential (primary) hypertension: Secondary | ICD-10-CM | POA: Diagnosis not present

## 2023-07-12 DIAGNOSIS — E1122 Type 2 diabetes mellitus with diabetic chronic kidney disease: Secondary | ICD-10-CM | POA: Diagnosis not present

## 2023-07-12 DIAGNOSIS — G720 Drug-induced myopathy: Secondary | ICD-10-CM | POA: Diagnosis not present

## 2023-07-12 DIAGNOSIS — E11319 Type 2 diabetes mellitus with unspecified diabetic retinopathy without macular edema: Secondary | ICD-10-CM | POA: Diagnosis not present

## 2023-07-12 DIAGNOSIS — M109 Gout, unspecified: Secondary | ICD-10-CM | POA: Diagnosis not present

## 2023-08-08 ENCOUNTER — Ambulatory Visit
Admission: RE | Admit: 2023-08-08 | Discharge: 2023-08-08 | Disposition: A | Discharge status: Home / Self Care | Payer: Medicare Other | Source: Ambulatory Visit | Attending: Internal Medicine | Admitting: Internal Medicine

## 2023-08-08 DIAGNOSIS — E2839 Other primary ovarian failure: Secondary | ICD-10-CM

## 2023-08-08 DIAGNOSIS — M8588 Other specified disorders of bone density and structure, other site: Secondary | ICD-10-CM | POA: Diagnosis not present

## 2023-08-21 ENCOUNTER — Encounter (INDEPENDENT_AMBULATORY_CARE_PROVIDER_SITE_OTHER): Payer: Medicare Other | Admitting: Ophthalmology

## 2023-08-21 DIAGNOSIS — H35033 Hypertensive retinopathy, bilateral: Secondary | ICD-10-CM | POA: Diagnosis not present

## 2023-08-21 DIAGNOSIS — I10 Essential (primary) hypertension: Secondary | ICD-10-CM | POA: Diagnosis not present

## 2023-08-21 DIAGNOSIS — E113293 Type 2 diabetes mellitus with mild nonproliferative diabetic retinopathy without macular edema, bilateral: Secondary | ICD-10-CM

## 2023-08-21 DIAGNOSIS — Z7984 Long term (current) use of oral hypoglycemic drugs: Secondary | ICD-10-CM | POA: Diagnosis not present

## 2023-08-21 DIAGNOSIS — H43813 Vitreous degeneration, bilateral: Secondary | ICD-10-CM | POA: Diagnosis not present

## 2023-09-26 DIAGNOSIS — M109 Gout, unspecified: Secondary | ICD-10-CM | POA: Diagnosis not present

## 2023-12-09 DIAGNOSIS — E11319 Type 2 diabetes mellitus with unspecified diabetic retinopathy without macular edema: Secondary | ICD-10-CM | POA: Diagnosis not present

## 2023-12-09 DIAGNOSIS — E7800 Pure hypercholesterolemia, unspecified: Secondary | ICD-10-CM | POA: Diagnosis not present

## 2023-12-09 DIAGNOSIS — N1831 Chronic kidney disease, stage 3a: Secondary | ICD-10-CM | POA: Diagnosis not present

## 2023-12-09 DIAGNOSIS — E1122 Type 2 diabetes mellitus with diabetic chronic kidney disease: Secondary | ICD-10-CM | POA: Diagnosis not present

## 2024-01-06 DIAGNOSIS — E7800 Pure hypercholesterolemia, unspecified: Secondary | ICD-10-CM | POA: Diagnosis not present

## 2024-01-06 DIAGNOSIS — G720 Drug-induced myopathy: Secondary | ICD-10-CM | POA: Diagnosis not present

## 2024-01-06 DIAGNOSIS — Z0000 Encounter for general adult medical examination without abnormal findings: Secondary | ICD-10-CM | POA: Diagnosis not present

## 2024-01-06 DIAGNOSIS — I10 Essential (primary) hypertension: Secondary | ICD-10-CM | POA: Diagnosis not present

## 2024-01-06 DIAGNOSIS — M8588 Other specified disorders of bone density and structure, other site: Secondary | ICD-10-CM | POA: Diagnosis not present

## 2024-01-06 DIAGNOSIS — E1122 Type 2 diabetes mellitus with diabetic chronic kidney disease: Secondary | ICD-10-CM | POA: Diagnosis not present

## 2024-01-06 DIAGNOSIS — E11319 Type 2 diabetes mellitus with unspecified diabetic retinopathy without macular edema: Secondary | ICD-10-CM | POA: Diagnosis not present

## 2024-01-06 DIAGNOSIS — N1831 Chronic kidney disease, stage 3a: Secondary | ICD-10-CM | POA: Diagnosis not present

## 2024-01-09 DIAGNOSIS — E7800 Pure hypercholesterolemia, unspecified: Secondary | ICD-10-CM | POA: Diagnosis not present

## 2024-01-09 DIAGNOSIS — N1831 Chronic kidney disease, stage 3a: Secondary | ICD-10-CM | POA: Diagnosis not present

## 2024-01-09 DIAGNOSIS — E11319 Type 2 diabetes mellitus with unspecified diabetic retinopathy without macular edema: Secondary | ICD-10-CM | POA: Diagnosis not present

## 2024-01-09 DIAGNOSIS — E1122 Type 2 diabetes mellitus with diabetic chronic kidney disease: Secondary | ICD-10-CM | POA: Diagnosis not present

## 2024-02-09 DIAGNOSIS — E11319 Type 2 diabetes mellitus with unspecified diabetic retinopathy without macular edema: Secondary | ICD-10-CM | POA: Diagnosis not present

## 2024-02-09 DIAGNOSIS — E1122 Type 2 diabetes mellitus with diabetic chronic kidney disease: Secondary | ICD-10-CM | POA: Diagnosis not present

## 2024-02-09 DIAGNOSIS — N1831 Chronic kidney disease, stage 3a: Secondary | ICD-10-CM | POA: Diagnosis not present

## 2024-02-09 DIAGNOSIS — E7800 Pure hypercholesterolemia, unspecified: Secondary | ICD-10-CM | POA: Diagnosis not present

## 2024-03-10 DIAGNOSIS — E11319 Type 2 diabetes mellitus with unspecified diabetic retinopathy without macular edema: Secondary | ICD-10-CM | POA: Diagnosis not present

## 2024-03-10 DIAGNOSIS — E7800 Pure hypercholesterolemia, unspecified: Secondary | ICD-10-CM | POA: Diagnosis not present

## 2024-03-10 DIAGNOSIS — N1831 Chronic kidney disease, stage 3a: Secondary | ICD-10-CM | POA: Diagnosis not present

## 2024-03-10 DIAGNOSIS — E1122 Type 2 diabetes mellitus with diabetic chronic kidney disease: Secondary | ICD-10-CM | POA: Diagnosis not present

## 2024-04-09 ENCOUNTER — Other Ambulatory Visit: Payer: Self-pay | Admitting: Internal Medicine

## 2024-04-09 DIAGNOSIS — Z1231 Encounter for screening mammogram for malignant neoplasm of breast: Secondary | ICD-10-CM

## 2024-04-24 ENCOUNTER — Ambulatory Visit

## 2024-05-19 ENCOUNTER — Ambulatory Visit

## 2024-06-02 ENCOUNTER — Ambulatory Visit
Admission: RE | Admit: 2024-06-02 | Discharge: 2024-06-02 | Disposition: A | Discharge status: Home / Self Care | Source: Ambulatory Visit | Attending: Internal Medicine | Admitting: Internal Medicine

## 2024-06-02 DIAGNOSIS — Z1231 Encounter for screening mammogram for malignant neoplasm of breast: Secondary | ICD-10-CM

## 2024-06-22 ENCOUNTER — Encounter (INDEPENDENT_AMBULATORY_CARE_PROVIDER_SITE_OTHER): Payer: Medicare (Managed Care) | Admitting: Ophthalmology

## 2024-07-13 ENCOUNTER — Encounter (INDEPENDENT_AMBULATORY_CARE_PROVIDER_SITE_OTHER): Payer: Medicare (Managed Care) | Admitting: Ophthalmology

## 2024-08-25 ENCOUNTER — Encounter (INDEPENDENT_AMBULATORY_CARE_PROVIDER_SITE_OTHER): Payer: Medicare (Managed Care) | Admitting: Ophthalmology
